# Patient Record
Sex: Female | Born: 1970 | Race: White | Hispanic: No | Marital: Married | Smoking: Never smoker
Health system: Southern US, Community
[De-identification: ages and names within clinical notes are randomized; demographics above are authoritative.]

## PROBLEM LIST (undated history)

## (undated) DIAGNOSIS — K30 Functional dyspepsia: Secondary | ICD-10-CM

## (undated) DIAGNOSIS — K219 Gastro-esophageal reflux disease without esophagitis: Secondary | ICD-10-CM

## (undated) DIAGNOSIS — Z975 Presence of (intrauterine) contraceptive device: Secondary | ICD-10-CM

## (undated) DIAGNOSIS — K9049 Malabsorption due to intolerance, not elsewhere classified: Secondary | ICD-10-CM

## (undated) HISTORY — DX: Gastro-esophageal reflux disease without esophagitis: K21.9

## (undated) HISTORY — DX: Malabsorption due to intolerance, not elsewhere classified: K90.49

## (undated) HISTORY — DX: Functional dyspepsia: K30

## (undated) HISTORY — DX: Presence of (intrauterine) contraceptive device: Z97.5

---

## 2005-12-30 ENCOUNTER — Ambulatory Visit (HOSPITAL_COMMUNITY): Admission: RE | Admit: 2005-12-30 | Discharge: 2005-12-30 | Payer: Self-pay | Admitting: Urology

## 2006-12-29 ENCOUNTER — Encounter: Payer: Self-pay | Admitting: Obstetrics and Gynecology

## 2006-12-29 ENCOUNTER — Ambulatory Visit: Payer: Self-pay | Admitting: Gynecology

## 2006-12-29 ENCOUNTER — Inpatient Hospital Stay (HOSPITAL_COMMUNITY): Admission: AD | Admit: 2006-12-29 | Discharge: 2006-12-31 | Payer: Self-pay | Admitting: Obstetrics and Gynecology

## 2007-09-27 ENCOUNTER — Other Ambulatory Visit: Admission: RE | Admit: 2007-09-27 | Discharge: 2007-09-27 | Payer: Self-pay | Admitting: Obstetrics and Gynecology

## 2008-04-10 ENCOUNTER — Ambulatory Visit (HOSPITAL_COMMUNITY): Admission: RE | Admit: 2008-04-10 | Discharge: 2008-04-10 | Payer: Self-pay | Admitting: Family Medicine

## 2008-05-06 ENCOUNTER — Ambulatory Visit: Payer: Self-pay | Admitting: Urgent Care

## 2008-05-31 ENCOUNTER — Ambulatory Visit: Payer: Self-pay | Admitting: Gastroenterology

## 2008-05-31 ENCOUNTER — Ambulatory Visit (HOSPITAL_COMMUNITY): Admission: RE | Admit: 2008-05-31 | Discharge: 2008-05-31 | Payer: Self-pay | Admitting: Gastroenterology

## 2008-05-31 ENCOUNTER — Encounter: Payer: Self-pay | Admitting: Gastroenterology

## 2008-05-31 HISTORY — PX: ESOPHAGOGASTRODUODENOSCOPY: SHX1529

## 2008-10-22 ENCOUNTER — Other Ambulatory Visit: Admission: RE | Admit: 2008-10-22 | Discharge: 2008-10-22 | Payer: Self-pay | Admitting: Obstetrics and Gynecology

## 2009-05-09 ENCOUNTER — Ambulatory Visit (HOSPITAL_COMMUNITY): Admission: RE | Admit: 2009-05-09 | Discharge: 2009-05-09 | Payer: Self-pay | Admitting: Family Medicine

## 2009-07-23 IMAGING — US US ABDOMEN COMPLETE
1 series · 14 of 25 positions shown · non-contrast
Comparison: None

CLINICAL DATA: Epigastric abdominal pain, PPI responsive

ABDOMEN ULTRASOUND
TECHNIQUE: Complete abdominal ultrasound examination was performed
including evaluation of the liver, gallbladder, bile ducts,
pancreas, kidneys, spleen, IVC, and abdominal aorta.

[Series 1: unknown · 0.28mm/px · 14 of 87 slices shown]
[im 1/87]
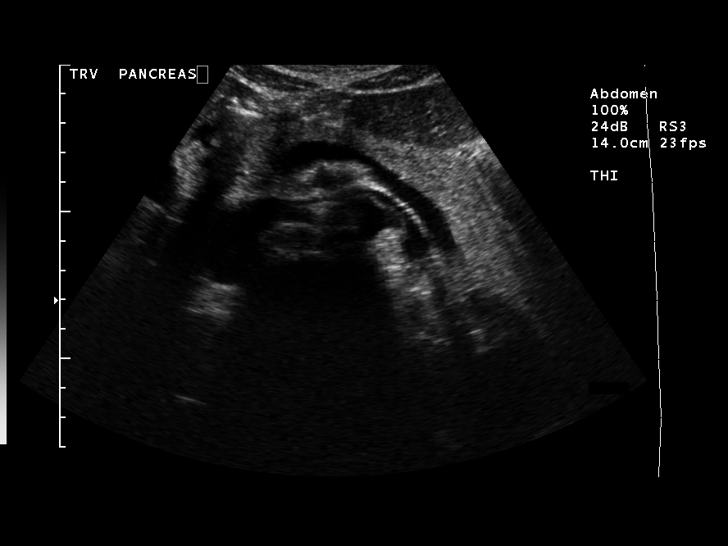
[im 8/87]
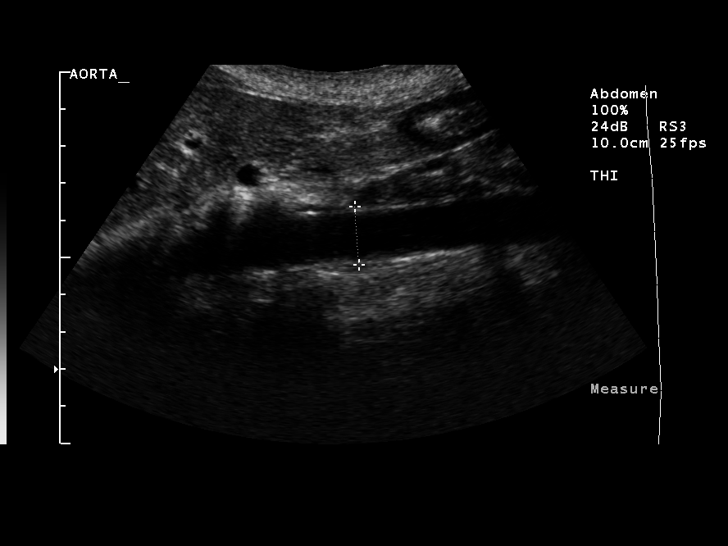
[im 15/87]
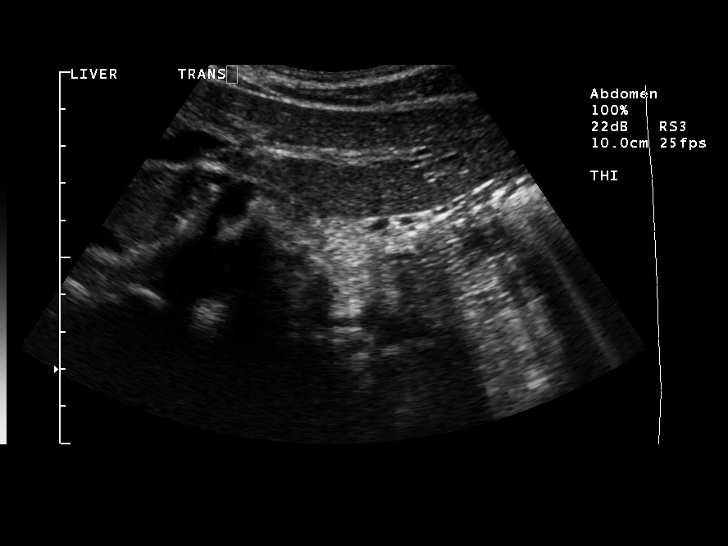
[im 22/87]
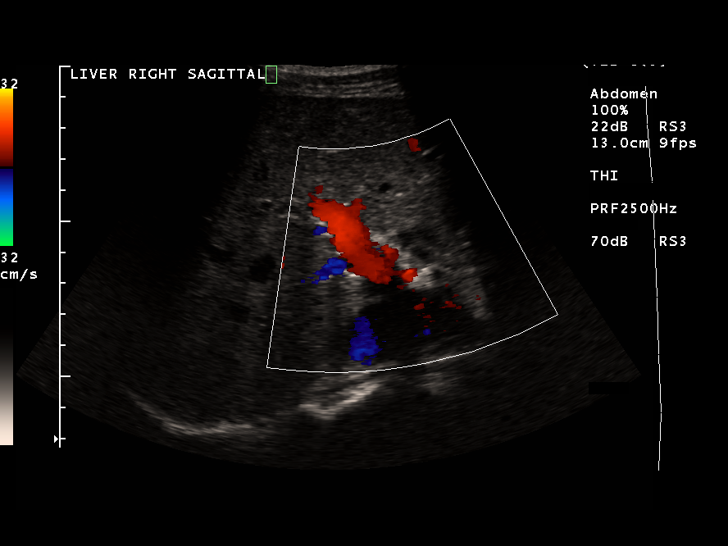
[im 29/87]
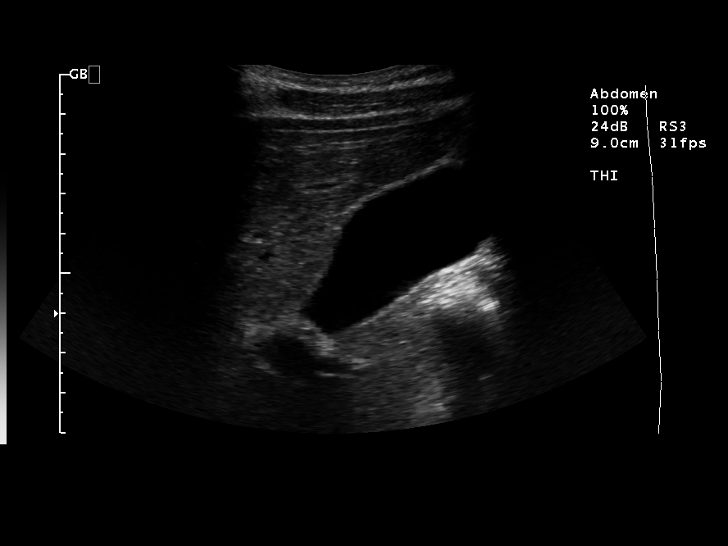
[im 33/87]
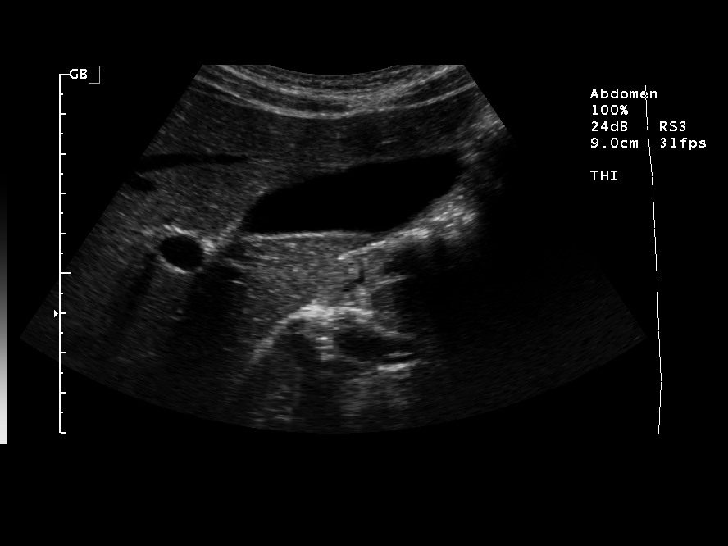
[im 40/87]
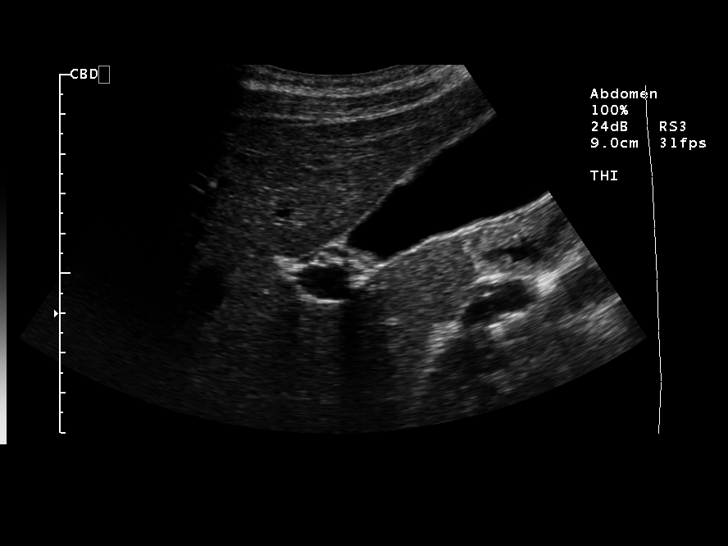
[im 47/87]
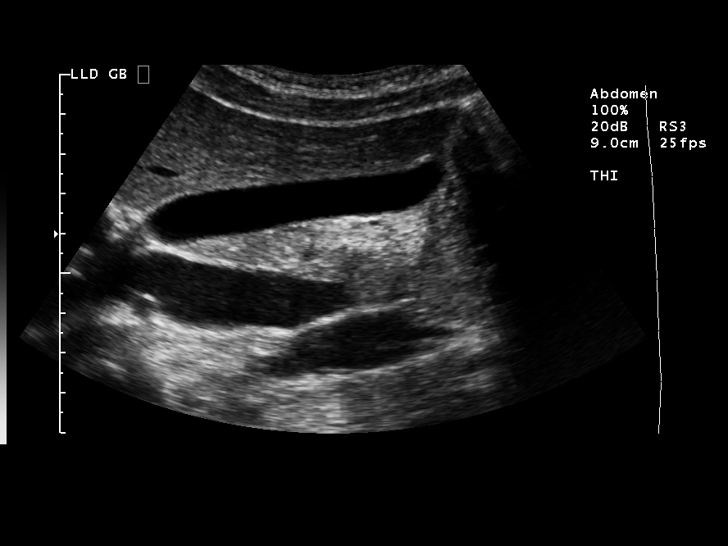
[im 54/87]
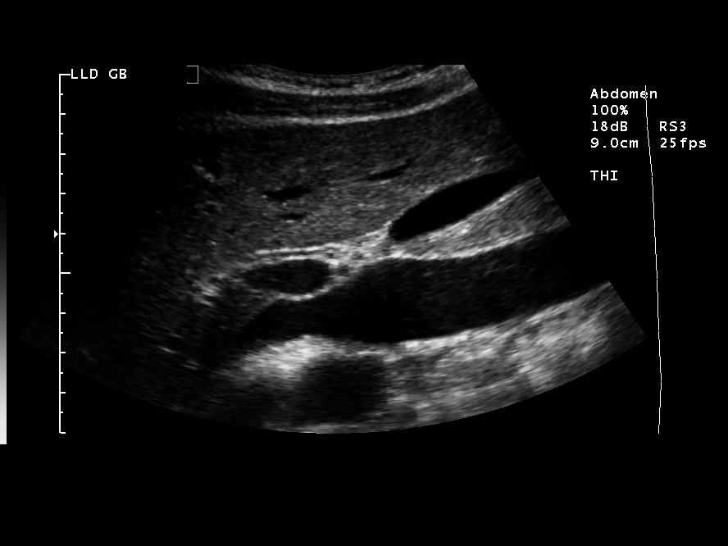
[im 58/87]
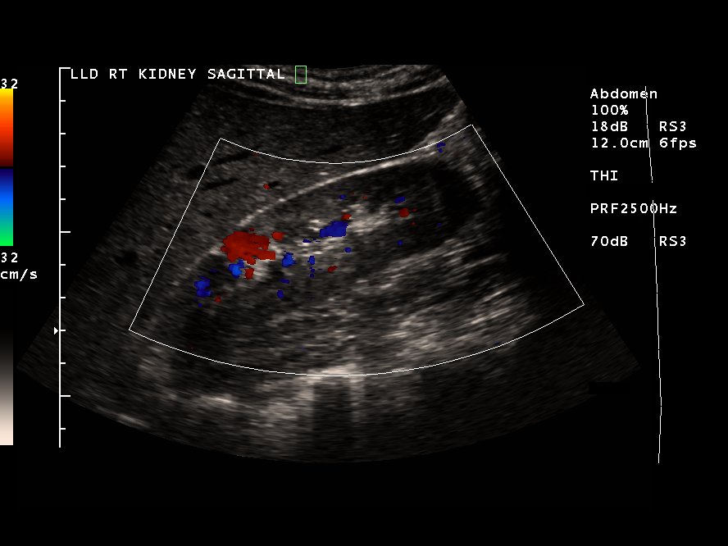
[im 65/87]
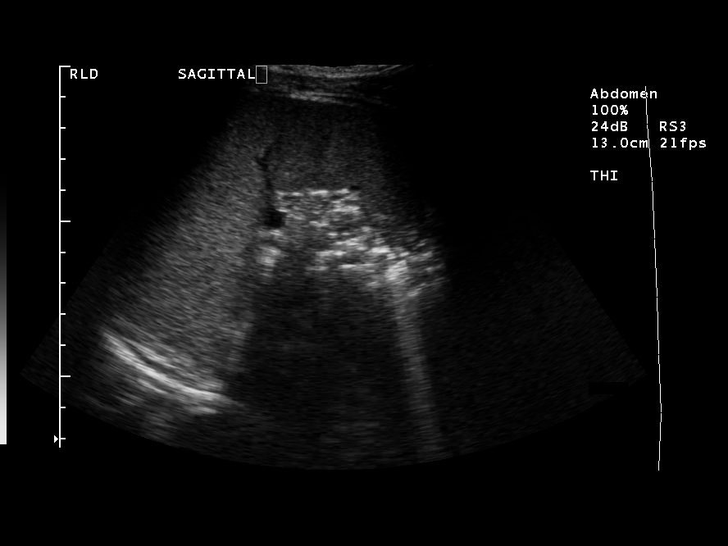
[im 72/87]
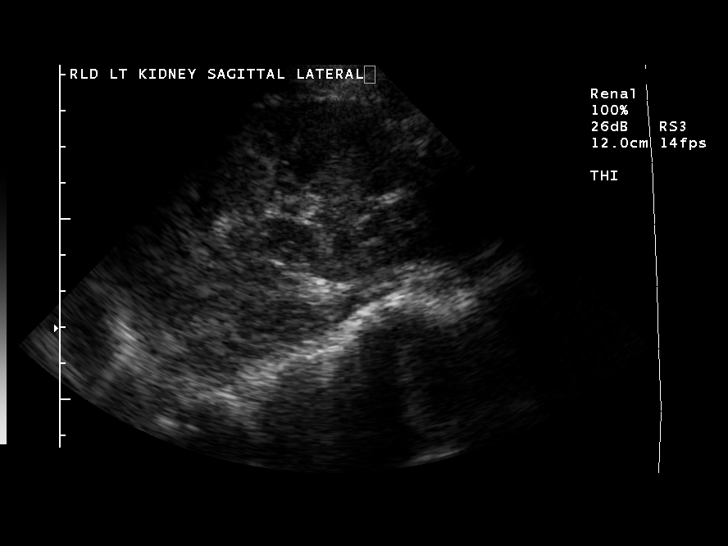
[im 79/87]
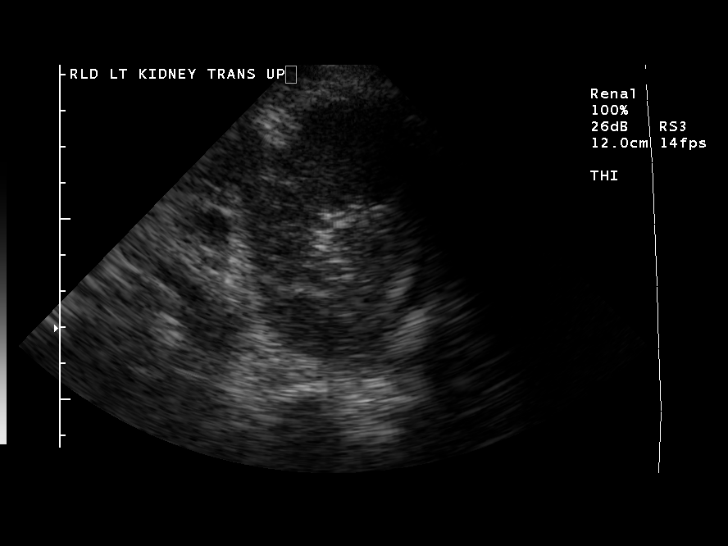
[im 87/87]
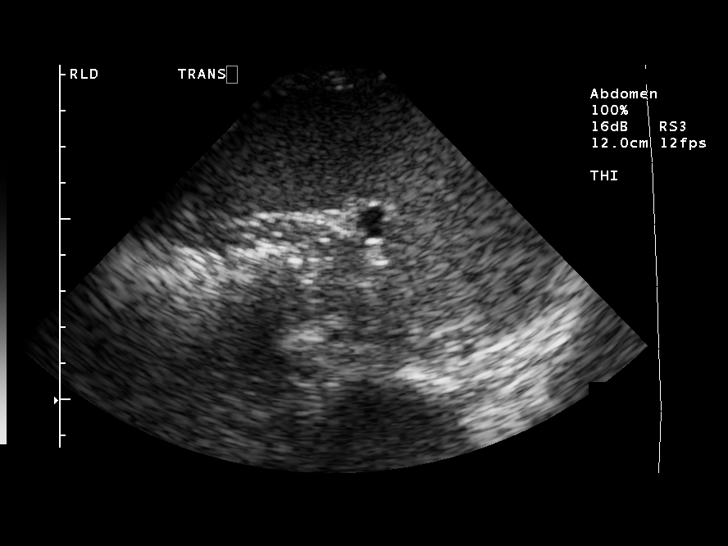

[14 of 25 positions shown; findings below may reference images not displayed]

FINDINGS: Gallbladder normally distended without stones or wall thickening.
No sonographic Murphy sign.
Common bile duct normal caliber, 2.1 mm diameter.
Liver, pancreas, and spleen normal appearance, spleen 11.1 cm
length.
Kidneys normal size and morphology, 11.4 cm length right and
cm length left.
Aorta and IVC normal.
No free fluid.
IMPRESSION: Normal upper abdominal ultrasound.

## 2009-11-05 ENCOUNTER — Other Ambulatory Visit: Admission: RE | Admit: 2009-11-05 | Discharge: 2009-11-05 | Payer: Self-pay | Admitting: Obstetrics & Gynecology

## 2010-01-30 DIAGNOSIS — Z8719 Personal history of other diseases of the digestive system: Secondary | ICD-10-CM

## 2010-01-30 DIAGNOSIS — K219 Gastro-esophageal reflux disease without esophagitis: Secondary | ICD-10-CM | POA: Insufficient documentation

## 2010-02-06 ENCOUNTER — Ambulatory Visit: Payer: Self-pay | Admitting: Gastroenterology

## 2010-02-09 DIAGNOSIS — K30 Functional dyspepsia: Secondary | ICD-10-CM | POA: Insufficient documentation

## 2010-04-02 ENCOUNTER — Ambulatory Visit: Payer: Self-pay | Admitting: Gastroenterology

## 2010-04-27 ENCOUNTER — Ambulatory Visit
Admission: RE | Admit: 2010-04-27 | Discharge: 2010-04-27 | Payer: Self-pay | Source: Home / Self Care | Attending: Urgent Care | Admitting: Urgent Care

## 2010-05-03 ENCOUNTER — Encounter: Payer: Self-pay | Admitting: Family Medicine

## 2010-05-04 ENCOUNTER — Encounter: Payer: Self-pay | Admitting: Urgent Care

## 2010-05-08 ENCOUNTER — Encounter: Payer: Self-pay | Admitting: Urgent Care

## 2010-05-12 NOTE — Assessment & Plan Note (Signed)
Summary: GERD, DYSPEPSIA   Visit Type:  Follow-up Visit Primary Care Provider:  [facility]  Chief Complaint:  reflux.  History of Present Illness: Was on Prevacid and in JAN 2011 changed to Nexium once daily. Can have breakthrough Sx. 2-3 weeks fine and then may have a week when she's constantly bothers her. Triggers: stressful, EtOH, omega-3. Weight steady. Appetite: good. No problems swallowing. Can have nausea with flares but never has thrown up. usu. during the day and not awakened with Sx.  Current Medications (verified): 1)  Nexium 40 Mg Cpdr (Esomeprazole Magnesium) .... 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 Eating on the run.  Husband travels and left alone to take care of 39 yo. No tobacco.  Review of Systems       DEC 2009: UGI-gastric polyps, NO REFLUX, NL SB    ABD U/S-NORMAL     130 LBS  H. PYLORI SEROLOGY NEG  Vital Signs:  Patient profile:   40 year old female Height:      66 inches Weight:      131 pounds BMI:     21.22 Temp:     97.6 degrees F oral Pulse rate:   64 / minute BP sitting:   100 / 70  (left arm) Cuff size:   regular  Vitals Entered By: Cloria Spring LPN (February 06, 2010 4:16 PM)  Physical Exam  General:  Well developed, well nourished, no acute distress. Head:  Normocephalic and atraumatic. Eyes:  PERRL, no icterus. Mouth:  No deformity or lesions. Lungs:  Clear throughout to auscultation. Heart:  Regular rate and rhythm; no murmurs. Abdomen:  Soft, nontender and nondistended. Normal bowel sounds.  Impression & Recommendations:  Problem # 1:  GERD (ICD-530.81) Pt most LIKELY HAS GERD, BUT FAVOR NON-ULCER DYSPEPSIA as etiology for her current Sx. Increase Nexium  to two times a day. Add Imipramine at bedtime.  Avoid reflux triggers if possible. SEE HO. FOLLOW UP IN 6 WEEKS.  Orders: Est. Patient Level IV (32440)  Problem # 2:  DYSPEPSIA (ICD-536.8) See#1  Orders: Est. Patient Level IV (10272)  CC: PCP  Patient Instructions: 1)  YOU MOST LIKELY HAVE GERD AND NON-ULCER DYSPEPSIA. 2)  Increase Nexium to two times a day. 3)  Add Imipramine at bedtime.  4)  Avoid reflux triggers if possible. SEE HO. 5)  FOLLOW UP IN 6 WEEKS. 6)  The medication list was reviewed and reconciled.  All changed / newly prescribed medications were explained.  A complete medication list was provided to the patient / caregiver. Prescriptions: NEXIUM 40 MG CPDR (ESOMEPRAZOLE MAGNESIUM) Take 1 tablet by mouth 30 minutes before breakfast and lunch.  Dx: GERD, FAILED ONCE A DAY THERAPY  #180 x 3   Entered and Authorized by:   West Bali MD   Signed by:   West Bali MD on 02/06/2010   Method used:   Print then Give to Patient   RxID:   [id number] IMIPRAMINE HCL 10 MG TABS (IMIPRAMINE HCL) 1 by mouth qhs  #30 x 5   Entered and Authorized by:   West Bali MD   Signed by:   West Bali MD on 02/06/2010   Method used:   Electronically to  [facility]* (retail)       [address]       [place], [place]  [address]       Ph: [phone]       Fax: [phone]   RxID:   [id number]   Appended Document: GERD, DYSPEPSIA 6 WK F/U APPT IS IN THE COMPUTER

## 2010-05-14 NOTE — Medication Information (Signed)
Summary: imipramine rx  imipramine rx   Imported By: Hendricks Limes LPN 16/01/9603 54:09:81  _____________________________________________________________________  External Attachment:    Type:   Image     Comment:   External Document

## 2010-05-14 NOTE — Medication Information (Signed)
Summary: Tax adviser   Imported By: Rexene Alberts 05/04/2010 08:26:20  _____________________________________________________________________  External Attachment:    Type:   Image     Comment:   External Document  Appended Document: RX Folder Please let pharm know.  Verify Imipramine 10mg  at bedtime. Thanks  Appended Document: RX Folder Informed Casimiro Needle, Teacher, early years/pre at [employer].

## 2010-05-14 NOTE — Assessment & Plan Note (Signed)
Summary: gerd/dypepsia/6 wk fu per SF/ss   Visit Type:  Follow-up Visit Primary Care Provider:  [facility]  Chief Complaint:  F/U gerd/dyspepsia.  History of Present Illness: 40 y/o caucasian female here for FU non-ulcer dyspepsia & GERD.  On imipramine 10mg  at bedtime & nexium 40mg  daily.  Doing very well.  100% better most days, except occassionally takes two times a day nexium usually around stress twice per month.  Denies nausea, vomiting, anorexia, wt loss, dysphagia, or odynophagia.    Current Problems (verified): 1)  Dyspepsia  (ICD-536.8) 2)  Gerd  (ICD-530.81) 3)  Gastric Polyp, Hx of  (ICD-V12.79)  Current Medications (verified): 1)  Nexium 40 Mg Cpdr (Esomeprazole Magnesium) .... Take 1 Tablet By Mouth Once A Day 2)  Imipramine Hcl 10 Mg Tabs (Imipramine Hcl) .Marland Kitchen.. 1 By Mouth Qhs  Allergies (verified): No Known Drug Allergies  Review of Systems      See HPI General:  Denies fever, chills, sweats, anorexia, fatigue, weakness, malaise, weight loss, and sleep disorder. CV:  Denies chest pains, angina, palpitations, syncope, dyspnea on exertion, orthopnea, PND, peripheral edema, and claudication. Resp:  Denies dyspnea at rest, dyspnea with exercise, cough, sputum, wheezing, coughing up blood, and pleurisy. GI:  See HPI; Denies jaundice and fecal incontinence. GU:  Denies urinary burning, blood in urine, nocturnal urination, urinary frequency, urinary incontinence, abnormal vaginal bleeding, amenorrhea, menorrhagia, and vaginal discharge; No periods since Mirena IUD 3 yrs. Derm:  Denies rash, itching, dry skin, hives, moles, warts, and unhealing ulcers. Psych:  Denies depression, anxiety, memory loss, suicidal ideation, hallucinations, paranoia, phobia, and confusion. Heme:  Denies bruising, bleeding, and enlarged lymph nodes.  Vital Signs:  Patient profile:   40 year old female Height:      66 inches Weight:      131.50 pounds BMI:     21.30 Temp:     98.0 degrees F  oral Pulse rate:   60 / minute BP sitting:   100 / 62  (left arm) Cuff size:   regular  Vitals Entered By: Cloria Spring LPN (April 27, 2010 11:02 AM)  Physical Exam  General:  Well developed, well nourished, no acute distress. Head:  Normocephalic and atraumatic. Eyes:  sclera clear, no icterus. Mouth:  No deformity or lesions. Neck:  Supple; no masses or thyromegaly. Heart:  Regular rate and rhythm; no murmurs. Abdomen:  Soft, nontender and nondistended. No masses, hepatosplenomegaly or hernias noted. Normal bowel sounds.without guarding and without rebound.   Msk:  Symmetrical with no gross deformities. Normal posture. Extremities:  No clubbing, cyanosis, edema or deformities noted. Neurologic:  Alert and  oriented x4;  grossly normal neurologically. Skin:  Intact without significant lesions or rashes. Cervical Nodes:  No significant cervical adenopathy. Psych:  Alert and cooperative. Normal mood and affect.  Impression & Recommendations:  Problem # 1:  GERD (ICD-530.81) Doing very well on Nexium 40 mg daily  Orders: Est. Patient Level III (16109)  Problem # 2:  DYSPEPSIA (ICD-536.8) Non-ulcer,  Improved w/ addition of imipramine  Medications Added to Medication List This Visit: 1)  Nexium 40 Mg Cpdr (Esomeprazole magnesium) .... Take 1 tablet by mouth once a day  Patient Instructions: 1)  Please schedule a follow-up appointment in 6 months. 2)  Continue imipramine & nexium  Prescriptions: IMIPRAMINE HCL 10 MG TABS (IMIPRAMINE HCL) 1 by mouth qhs  #90 x 3   Entered and Authorized by:   Joselyn Arrow FNP-BC   Signed by:  Joselyn Arrow FNP-BC on 04/27/2010   Method used:   Print then Give to Patient   RxID:   [id number]   Appended Document: gerd/dypepsia/6 wk fu per SF/ss 6 MONTH F/U OPV IS IN THE COMPUTER

## 2010-06-23 ENCOUNTER — Encounter: Payer: Self-pay | Admitting: Urgent Care

## 2010-06-30 NOTE — Letter (Signed)
Summary: NEW RX REQ IMIPRAMINE POW  NEW RX REQ IMIPRAMINE POW   Imported By: Rexene Alberts 06/23/2010 10:58:03  _____________________________________________________________________  External Attachment:    Type:   Image     Comment:   External Document

## 2010-08-21 IMAGING — US US ABDOMEN COMPLETE
1 series · 14 of 25 positions shown · non-contrast
Comparison: None.

CLINICAL DATA: Abdominal pain.  Gastroesophageal reflux disease.

ABDOMINAL ULTRASOUND COMPLETE

[Series 1: us abdomen complete · 0.30mm/px · 14 of 60 slices shown]
[im 1/60]
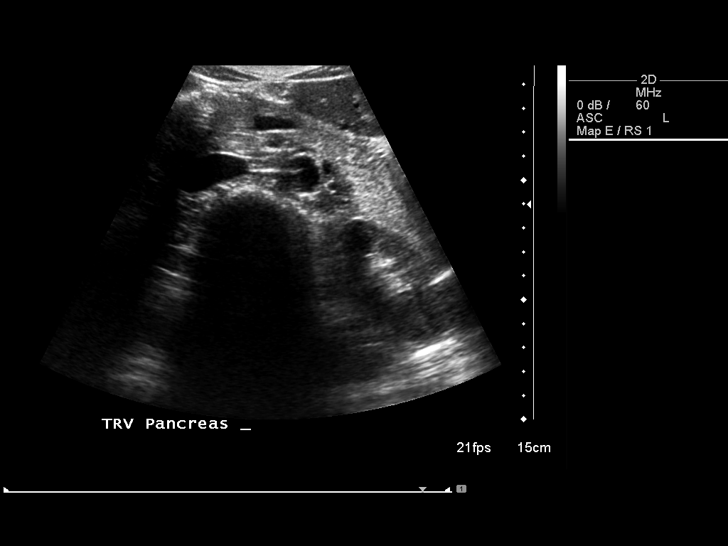
[im 5/60]
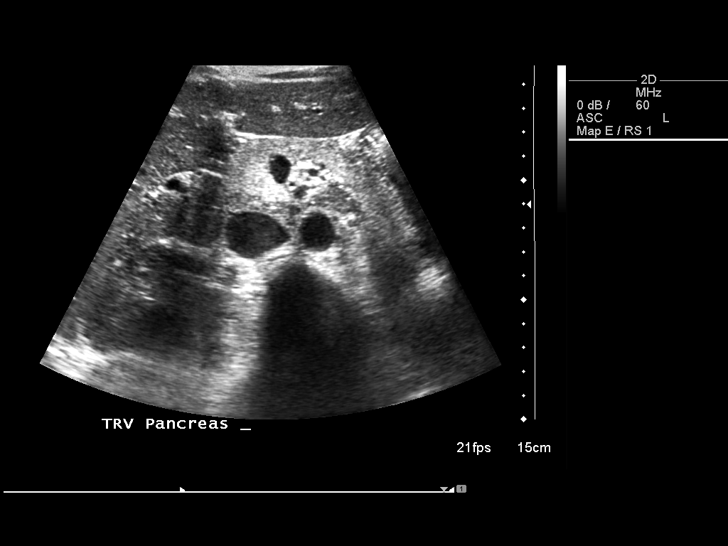
[im 10/60]
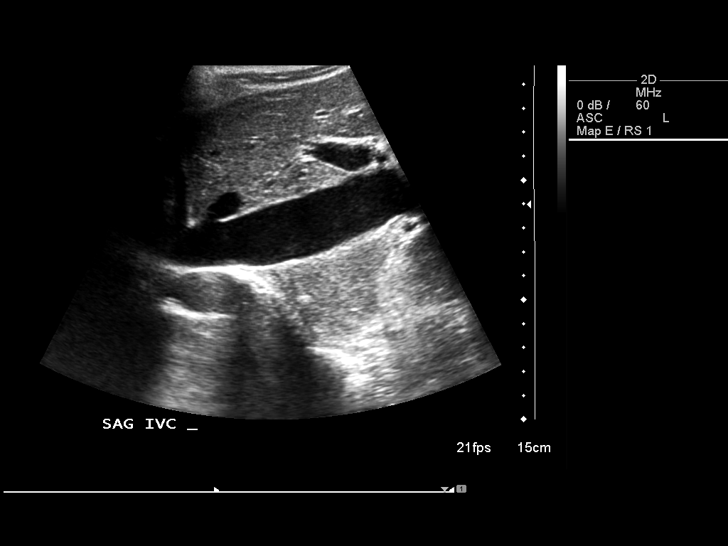
[im 15/60]
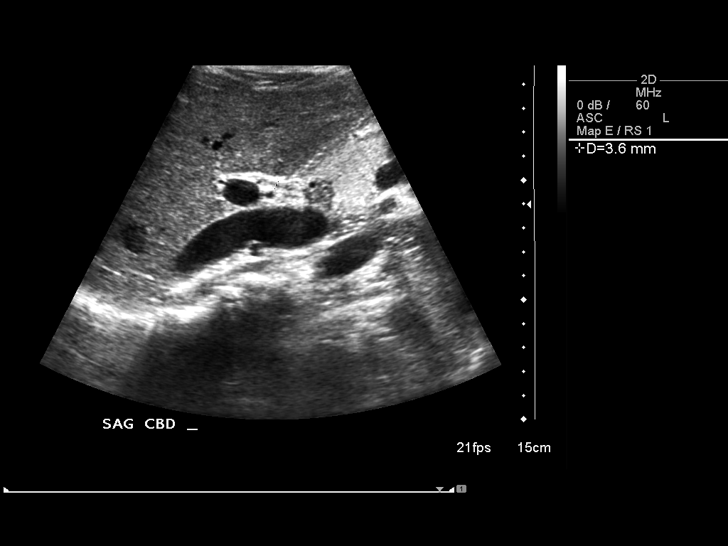
[im 20/60]
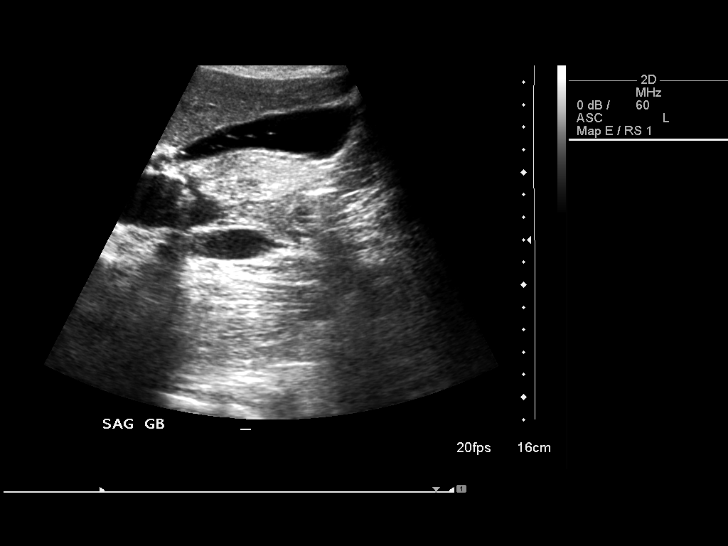
[im 23/60]
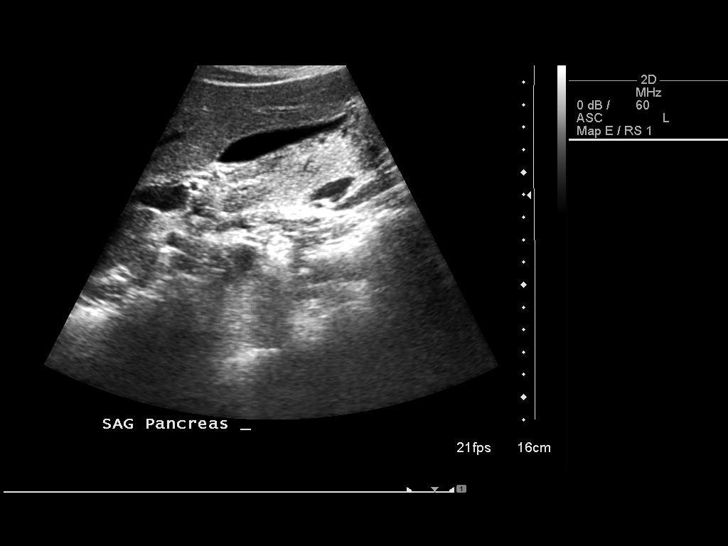
[im 28/60]
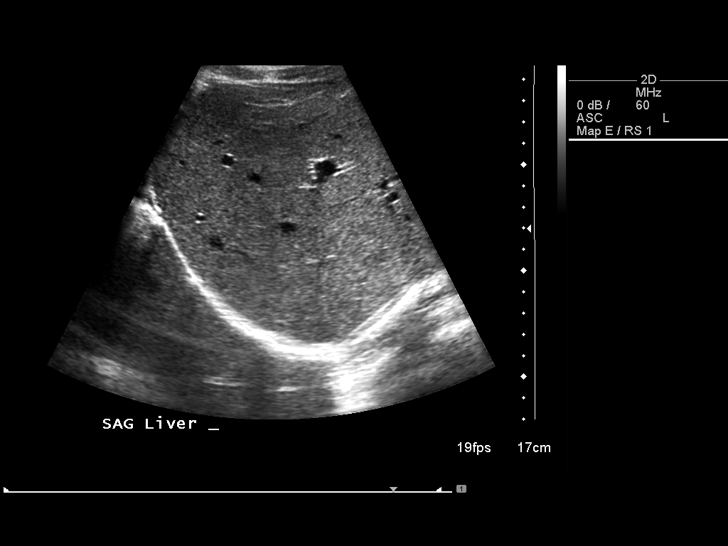
[im 32/60]
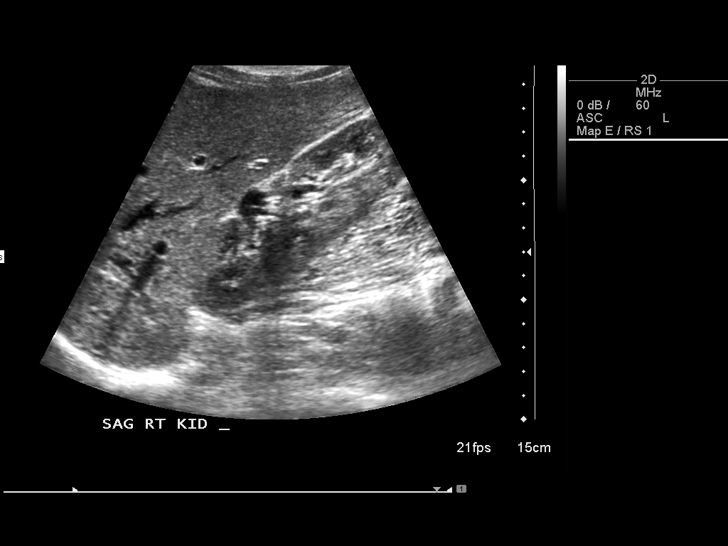
[im 37/60]
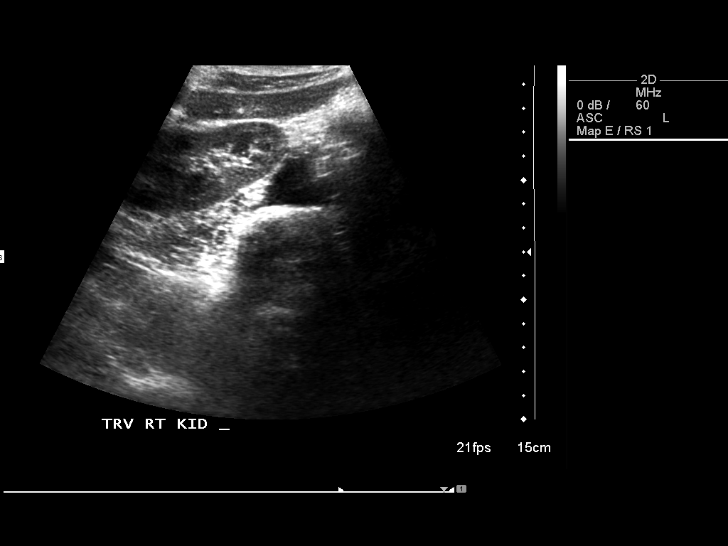
[im 40/60]
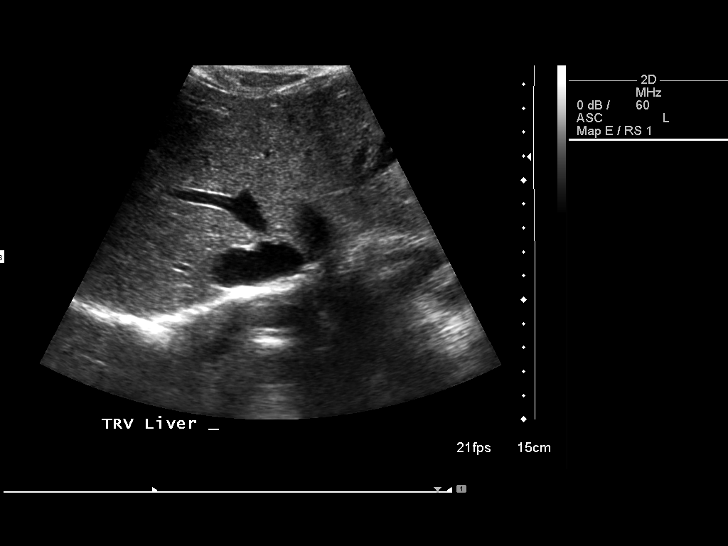
[im 45/60]
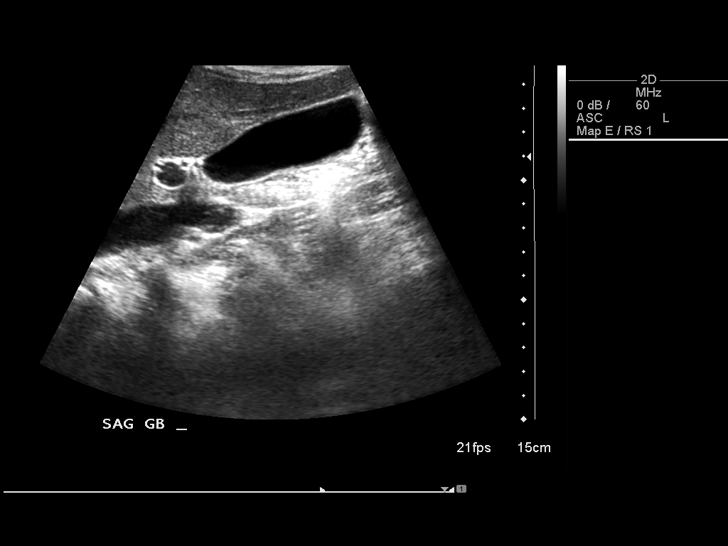
[im 50/60]
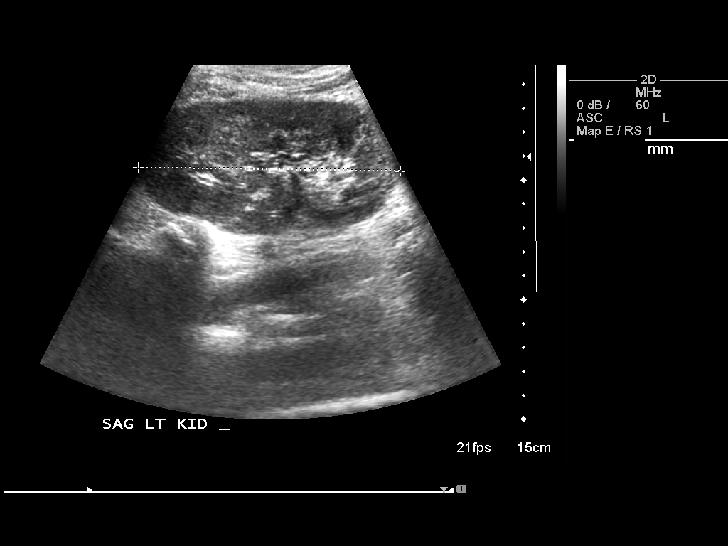
[im 55/60]
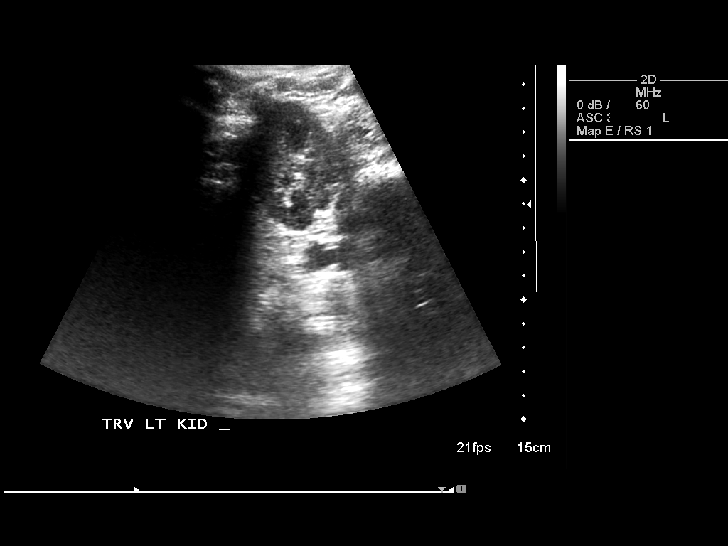
[im 60/60]
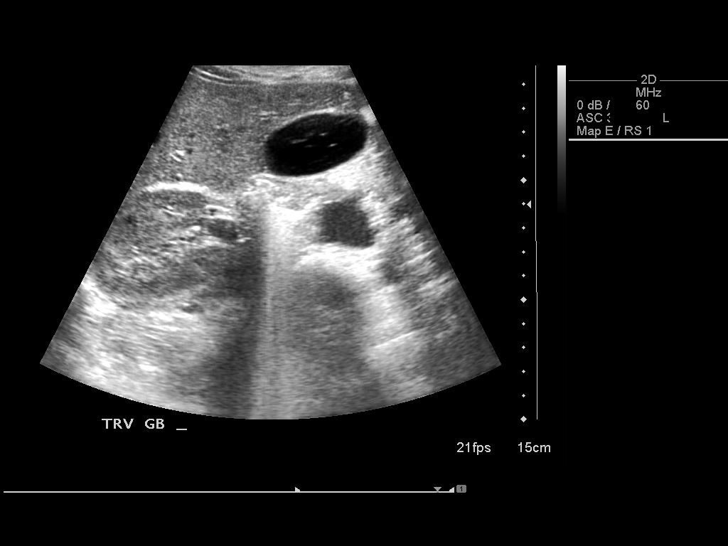

[14 of 25 positions shown; findings below may reference images not displayed]

FINDINGS: Gallbladder:  No gallstones, gallbladder wall thickening, or
pericholecystic fluid.  There is gallbladder sludge and small
echogenic foci which may be due [REDACTED]s.  No distinct
gallstones.

Common Bile Duct:  Within normal limits in caliber.  3.6 mm
diameter of the CBD.

Liver:  No focal lesion identified.  Within normal limits in
parenchymal echogenicity.

IVC:  Appears normal.

Pancreas:  Although the pancreas is difficult to visualize in its
entirety, no focal pancreatic abnormality is identified.

Spleen:  Within normal limits in size and echotexture.  Splenic
length is 9.6 cm.

Right kidney:  Normal in size and parenchymal echogenicity.  No
evidence of mass or hydronephrosis.   Right kidney measures 11.4 cm
in length.

Left kidney:  Normal in size and parenchymal echogenicity.  No
evidence of mass or hydronephrosis.  Left kidney measures 10.9 cm
in length.

Abdominal Aorta:  No aneurysm identified.
IMPRESSION: No definite gallstones.  No biliary ductal dilatation.  No acute
findings.

## 2010-08-25 NOTE — Consult Note (Signed)
NAMECAMARIA, [name] NO.:  [id number]   MEDICAL RECORD NO.:  [id number]          PATIENT TYPE:  AMB   LOCATION:  DAY                           FACILITY:  [facility]   PHYSICIAN:  Kassie Mends, M.D.      DATE OF BIRTH:  07-31-1970   DATE OF CONSULTATION:  DATE OF DISCHARGE:                                 CONSULTATION   REFERRING PHYSICIAN:  Patrica Duel, MD   REASON FOR CONSULTATION:  Gastric polyps and refractory GERD.   HISTORY OF PRESENT ILLNESS:  [name] is a 40 year old Caucasian  female.  [name] has had refractory heartburn and indigestion and just  overall sense of churning in acid in her stomach and abdomen since  December 2009.  [name] has had some transient nausea without vomiting as  well.  [name] has had some anorexia and abdominal bloating.  [name] has been  on Prevacid 30 mg daily for 2 years now.  [name] denies any dysphagia or  odynophagia.  [name] is having normal soft brown bowel movements.  Denies  any diarrhea, constipation, rectal bleeding, or melena.  Her weight has  remained stable.  Her appetite is good.  [name] rarely takes ibuprofen for  headaches about once a week.  Upper GI series on April 10, 2008,  shows at least 3 polypoid lesions within the mid stomach.  [name] had an  abdominal ultrasound on April 10, 2008, which was normal.  [name] had a  negative H. pylori.   CURRENT MEDICATIONS:  Prevacid 30 mg daily.   ALLERGIES:  No known drug allergies.   PAST MEDICAL HISTORY:  [name] had an IUD placed in December 2008.  [name] has  chronic GERD.   FAMILY HISTORY:  There is no known family history of colorectal  carcinoma, liver or chronic GI problems.  [name] are both in  their 75s and healthy.  [name] has 1 healthy brother.   SOCIAL HISTORY:  [name] has been married for 12 years.  [name] has 1  healthy 59-month-old son.  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  [name] denies any tobacco or drug use.  [name] usually has a glass  of rum and coke in  the evenings.   REVIEW OF SYSTEMS:  See HPI.  Otherwise negative.   PHYSICAL EXAMINATION:  VITAL SIGNS:  Weight 131 pounds.  Height 56  inches, temperature 97, and blood pressure 88/60, pulse 72.  GENERAL:  [name] is a thin Caucasian female who is alert, oriented,  pleasant, and cooperative in no acute distress.  HEENT:  Sclerae clear, nonicteric.  Conjunctivae pink.  Oropharynx pink  and moist without any lesions.  NECK:  Supple.  [name] does have some shoddy both from submandibular and  anterior cervical nodes.  CHEST:  Heart regular rate and rhythm.  Normal S1, S2 without any  murmurs, clicks, rubs, or gallops.  LUNGS:  Clear to auscultation bilaterally.  ABDOMEN:  Positive bowel sounds x4.  No bruits auscultated.  Soft,  nontender, and nondistended without palpable mass or hepatosplenomegaly.  No rebound, tenderness or guarding.  EXTREMITIES:  Without clubbing or edema.   IMPRESSION:  [name] is a 40 year old Caucasian female with  refractory gastroesophageal reflux disease as well as abdominal bloating  despite proton pump inhibitor.  [name] also has several lesions seen on  upper gastrointestinal, which seem to be polypoid.  [name] is going to  require further evaluation to rule out gastric polyps versus malignancy  versus peptic ulcer disease, versus gastritis.   PLAN:  1. [name] should continue Prevacid 30 mg daily for now.  2. EGD with Dr. Cira Servant in the near future.  Discussed this procedure      including risks and benefits which include but are not limited to      bleeding, infection, perforation, or drug reaction.  [name] agrees      with the plan and consent will be obtained.   Thank you Dr. Nobie Putnam for allowing [place] to participate in the care of Ms.  Chang.      Lorenza Burton, N.P.      Kassie Mends, M.D.  Electronically Signed    KJ/MEDQ  D:  05/06/2008  T:  05/07/2008  Job:  [id number]   cc:   Patrica Duel, M.D.  Fax: [phone]

## 2010-08-25 NOTE — Op Note (Signed)
NAMERAMESHA, POSTER                 ACCOUNT NO.:  [id number]   MEDICAL RECORD NO.:  [id number]          PATIENT TYPE:  AMB   LOCATION:  DAY                           FACILITY:  [facility]   PHYSICIAN:  Kassie Mends, M.D.      DATE OF BIRTH:  Aug 08, 1970   DATE OF PROCEDURE:  05/31/2008  DATE OF DISCHARGE:                               OPERATIVE REPORT   REFERRING PHYSICIAN:  Patrica Duel, MD   PROCEDURE:  Esophagogastroduodenoscopy with cold forceps biopsy.   INDICATION FOR EXAM:  Ms. Furrow is a 40 year old female who complained  of heartburn and indigestion.  She states that while she takes her  Prevacid, she does well.  She try to stop it and her symptoms recur.  She did have a upper GI series in December, which showed polypoid  lesions in the stomach.  The procedure is being performed to evaluate  the polyps.   FINDINGS:  1. Normal esophagus without evidence of Barrett mass, erosion,      ulceration, or stricture.  2. Multiple benign-appearing gastric polyps.  The polyps were in the      body and the fundus.  Biopsies were obtained via cold forceps.  No      evidence of ulcers, erosions, or erythema.  3. Normal duodenal bulb, ampulla, and second portion of the duodenum.   DIAGNOSIS:  Gastric polyps, likely secondary to fundic gland polyps.   RECOMMENDATIONS:  1. No aspirin, NSAIDs, or anticoagulation for 5 days.  She may resume      her previous diet.  She should continue Prevacid indefinitely.  2. Will call her with the results of her biopsies.   MEDICATIONS:  1. Demerol 50 mg IV.  2. Versed 4 mg IV.   PROCEDURE TECHNIQUE:  Physical exam was performed.  Informed consent was  obtained from the patient after explaining benefits, risks, and  alternatives to the procedure.  The patient was connected to the monitor  and placed in left lateral position.  Continuous oxygen was provided by  nasal cannula.  IV medicine administered through an indwelling cannula.  After administration  of sedation, the patient's esophagus was intubated.  Scope was advanced under direct visualization to the second portion of  the duodenum.  Scope was moved slowly by careful examining the color,  texture, anatomy, and integrity mucosa on the way out.  The patient was  recovered in endoscopy and discharged home in satisfactory condition.  The patient was a little bit of difficulty arouse prior to leaving the  endoscopy room.  She spent some time in the PACU.  She did have recorded  heart rate in the high 40s while she was sleeping.  She remained  hemodynamically stable.   PATH:  Benign polyps. Continue Prevacid.      Kassie Mends, M.D.  Electronically Signed     SM/MEDQ  D:  05/31/2008  T:  06/01/2008  Job:  [id number]   cc:   Patrica Duel, M.D.  Fax: [phone]

## 2010-08-31 ENCOUNTER — Other Ambulatory Visit: Payer: Self-pay | Admitting: Gastroenterology

## 2010-09-01 NOTE — Telephone Encounter (Signed)
LMOM for pt to call in reference to prescriptions.

## 2010-09-01 NOTE — Telephone Encounter (Signed)
Spoke with pharmacist at [facility], pt has been getting refills from Rx written by Dr. Darrick Penna for # 30 tablets, started back in 02/06/2010.    Also, I called [facility], pt had original Rx filled on 05/03/2010 for # 90. She had refill on 08/04/2010 for # 90.

## 2010-09-01 NOTE — Telephone Encounter (Signed)
Patient was given #90 with 3 refills in 04/2010 by KJ. Should last her for one year. Find out why we are getting request.

## 2010-09-03 MED ORDER — IMIPRAMINE HCL 10 MG PO TABS: 10.0000 mg | ORAL_TABLET | Freq: Every day | ORAL | Status: DC

## 2010-09-03 NOTE — Telephone Encounter (Signed)
Pt said she has gotten the Imipramine at [address] and [place]. But she is only taking one tablet daily. She said the ones from [facility] didn't seem to help as much and she had stomach problems when she was taking that so she went back to [facility] and got the Rx and it seem to help more.

## 2010-09-03 NOTE — Telephone Encounter (Signed)
Will send two refills to [facility]. She should not take both imipramines at same time.

## 2010-09-03 NOTE — Telephone Encounter (Signed)
LMOM for a return call @ home. Work number has been changed/disconnected.

## 2010-09-04 NOTE — Telephone Encounter (Signed)
LMOM for pt to call. 

## 2010-09-08 NOTE — Telephone Encounter (Signed)
Pt informed. Said he threw away the ones from [facility] because they didn't work.

## 2010-10-15 ENCOUNTER — Encounter: Payer: Self-pay | Admitting: Gastroenterology

## 2010-11-03 ENCOUNTER — Ambulatory Visit (INDEPENDENT_AMBULATORY_CARE_PROVIDER_SITE_OTHER): Payer: 59 | Admitting: Gastroenterology

## 2010-11-03 ENCOUNTER — Encounter: Payer: Self-pay | Admitting: Gastroenterology

## 2010-11-03 DIAGNOSIS — K3189 Other diseases of stomach and duodenum: Secondary | ICD-10-CM

## 2010-11-03 DIAGNOSIS — K219 Gastro-esophageal reflux disease without esophagitis: Secondary | ICD-10-CM

## 2010-11-03 DIAGNOSIS — K30 Functional dyspepsia: Secondary | ICD-10-CM

## 2010-11-03 NOTE — Progress Notes (Signed)
Cc to PCP 

## 2010-11-03 NOTE — Progress Notes (Signed)
Primary Care Physician: Jonell Cluck, MD  Primary Gastroenterologist:  Jonette Eva, MD   Chief Complaint  Patient presents with  . Follow-up    HPI: [name] is a 41 y.o. female here for followup of nonulcer dyspepsia and GERD. Doing well on Nexium once in the morning and imipramine at night. No abdominal pain. No constipation or diarrhea. No heartburn, vomiting, unintentional weight loss, melena, rectal bleeding. She voiced concerns regarding chronic PPI use and calcium absorption. Questions were answered    Current Outpatient Prescriptions  Medication Sig Dispense Refill  . esomeprazole (NEXIUM) 40 MG capsule Take 40 mg by mouth daily before breakfast.        . imipramine (TOFRANIL) 10 MG tablet Take 1 tablet (10 mg total) by mouth at bedtime.  30 tablet  2    Allergies as of 11/03/2010  . (No Known Allergies)    ROS:  General: Negative for anorexia, weight loss, fever, chills, fatigue, weakness. ENT: Negative for hoarseness, difficulty swallowing , nasal congestion. CV: Negative for chest pain, angina, palpitations, dyspnea on exertion, peripheral edema.  Respiratory: Negative for dyspnea at rest, dyspnea on exertion, cough, sputum, wheezing.  GI: See history of present illness. GU:  Negative for dysuria, hematuria, urinary incontinence, urinary frequency, nocturnal urination.  Endo: Negative for unusual weight change.    Physical Examination:   BP 104/65  Pulse 72  Temp(Src) 97.4 F (36.3 C) (Temporal)  Ht 5\' 6"  (1.676 m)  Wt 131 lb 9.6 oz (59.693 kg)  BMI 21.24 kg/m2  General: Well-nourished, well-developed in no acute distress.  Eyes: No icterus. Mouth: Oropharyngeal mucosa moist and pink , no lesions erythema or exudate. Lungs: Clear to auscultation bilaterally.  Heart: Regular rate and rhythm, no murmurs rubs or gallops.  Abdomen: Bowel sounds are normal, nontender, nondistended, no hepatosplenomegaly or masses, no abdominal bruits or hernia , no  rebound or guarding.   Extremities: No lower extremity edema. No clubbing or edema. Neuro: Alert and oriented x 4   Skin: Warm and dry, no jaundice.   Psych: Alert and cooperative, normal mood and affect.

## 2010-11-03 NOTE — Assessment & Plan Note (Signed)
Doing well on imipramine and Nexium. Continue current regimen. Office visit in one year.

## 2010-11-03 NOTE — Assessment & Plan Note (Addendum)
Doing well on current regimen. 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 Advised that she could consider decreasing her Nexium to every other day however her abdominal pain returns or she has heartburn regularly she should resume the Nexium daily. She voiced her understanding. Office visit one year.  Continue antireflux measures.

## 2010-11-04 ENCOUNTER — Ambulatory Visit: Payer: Self-pay | Admitting: Gastroenterology

## 2010-11-10 ENCOUNTER — Other Ambulatory Visit: Payer: Self-pay | Admitting: Obstetrics & Gynecology

## 2010-11-10 ENCOUNTER — Other Ambulatory Visit (HOSPITAL_COMMUNITY)
Admission: RE | Admit: 2010-11-10 | Discharge: 2010-11-10 | Disposition: A | Discharge status: Home / Self Care | Payer: 59 | Source: Ambulatory Visit | Attending: Obstetrics & Gynecology | Admitting: Obstetrics & Gynecology

## 2010-11-10 DIAGNOSIS — Z01419 Encounter for gynecological examination (general) (routine) without abnormal findings: Secondary | ICD-10-CM | POA: Insufficient documentation

## 2010-11-16 NOTE — Progress Notes (Signed)
AGREE

## 2011-01-21 LAB — CBC
HCT: 34.3 — ABNORMAL LOW
Hemoglobin: 12.1
MCV: 98.7
RDW: 12.2

## 2011-01-21 LAB — RH IMMUNE GLOB WKUP(>/=20WKS)(NOT WOMEN'S HOSP)

## 2011-02-22 ENCOUNTER — Telehealth: Payer: Self-pay | Admitting: Gastroenterology

## 2011-02-22 NOTE — Telephone Encounter (Signed)
[facility] [place]

## 2011-02-22 NOTE — Telephone Encounter (Signed)
NEXIUM 40 MG 30 MINUTES PRIOR TO BREAKFAST #31, [id number].

## 2011-02-22 NOTE — Telephone Encounter (Signed)
Wants her nexium refilled but has been getting it through mail order an now [facility] [place]

## 2011-02-24 ENCOUNTER — Other Ambulatory Visit: Payer: Self-pay

## 2011-02-24 MED ORDER — IMIPRAMINE HCL 10 MG PO TABS: 10.0000 mg | ORAL_TABLET | Freq: Every day | ORAL | Status: DC

## 2011-06-02 ENCOUNTER — Other Ambulatory Visit: Payer: Self-pay | Admitting: Gastroenterology

## 2011-10-07 ENCOUNTER — Other Ambulatory Visit: Payer: Self-pay | Admitting: Gastroenterology

## 2011-11-02 ENCOUNTER — Other Ambulatory Visit: Payer: Self-pay | Admitting: Gastroenterology

## 2011-11-02 NOTE — Telephone Encounter (Signed)
Can we find out what the pharmacy means by this?

## 2011-11-03 NOTE — Telephone Encounter (Signed)
Called the pharmacy to see what was going on. They said that she need to have a office visit before any refills. I told them that she did come in but we could not send it, do they are going to try and fill it.

## 2011-11-25 ENCOUNTER — Other Ambulatory Visit (HOSPITAL_COMMUNITY)
Admission: RE | Admit: 2011-11-25 | Discharge: 2011-11-25 | Disposition: A | Discharge status: Home / Self Care | Payer: 59 | Source: Ambulatory Visit | Attending: Obstetrics & Gynecology | Admitting: Obstetrics & Gynecology

## 2011-11-25 ENCOUNTER — Other Ambulatory Visit: Payer: Self-pay | Admitting: Obstetrics & Gynecology

## 2011-11-25 DIAGNOSIS — Z01419 Encounter for gynecological examination (general) (routine) without abnormal findings: Secondary | ICD-10-CM | POA: Insufficient documentation

## 2011-11-26 ENCOUNTER — Other Ambulatory Visit: Payer: Self-pay | Admitting: Obstetrics & Gynecology

## 2011-11-26 DIAGNOSIS — Z139 Encounter for screening, unspecified: Secondary | ICD-10-CM

## 2012-01-03 ENCOUNTER — Ambulatory Visit (HOSPITAL_COMMUNITY)
Admission: RE | Admit: 2012-01-03 | Discharge: 2012-01-03 | Disposition: A | Discharge status: Home / Self Care | Payer: 59 | Source: Ambulatory Visit | Attending: Obstetrics & Gynecology | Admitting: Obstetrics & Gynecology

## 2012-01-03 DIAGNOSIS — Z139 Encounter for screening, unspecified: Secondary | ICD-10-CM

## 2012-01-03 DIAGNOSIS — Z1231 Encounter for screening mammogram for malignant neoplasm of breast: Secondary | ICD-10-CM | POA: Insufficient documentation

## 2012-02-23 ENCOUNTER — Other Ambulatory Visit: Payer: Self-pay | Admitting: Gastroenterology

## 2012-02-24 NOTE — Telephone Encounter (Signed)
She is due for her one yr f/u visit. RX sent.

## 2012-03-01 ENCOUNTER — Encounter: Payer: Self-pay | Admitting: Urgent Care

## 2012-03-01 NOTE — Telephone Encounter (Signed)
Mailed letter to patient to call our office to set up OV to further her refills °

## 2012-04-03 ENCOUNTER — Other Ambulatory Visit: Payer: Self-pay | Admitting: Gastroenterology

## 2012-04-03 MED ORDER — ESOMEPRAZOLE MAGNESIUM 40 MG PO CPDR: 40.0000 mg | DELAYED_RELEASE_CAPSULE | Freq: Every day | ORAL | Status: DC

## 2012-04-03 NOTE — Telephone Encounter (Signed)
[name] says she is out of her Imipramine and will need another refill before her next appt in Jan and she will also be out of her Nexium before Jan 20th

## 2012-04-03 NOTE — Addendum Note (Signed)
Addended by: Tiffany Kocher on: 04/03/2012 10:56 AM   Modules accepted: Orders

## 2012-04-03 NOTE — Telephone Encounter (Signed)
Forwarding to Refill box.  

## 2012-04-27 ENCOUNTER — Encounter: Payer: Self-pay | Admitting: Gastroenterology

## 2012-05-01 ENCOUNTER — Encounter: Payer: Self-pay | Admitting: Gastroenterology

## 2012-05-01 ENCOUNTER — Ambulatory Visit (INDEPENDENT_AMBULATORY_CARE_PROVIDER_SITE_OTHER): Payer: 59 | Admitting: Gastroenterology

## 2012-05-01 VITALS — BP 95/58 | HR 67 | Temp 98.2°F | Ht 66.0 in | Wt 136.0 lb

## 2012-05-01 DIAGNOSIS — K30 Functional dyspepsia: Secondary | ICD-10-CM

## 2012-05-01 DIAGNOSIS — K219 Gastro-esophageal reflux disease without esophagitis: Secondary | ICD-10-CM

## 2012-05-01 DIAGNOSIS — R1013 Epigastric pain: Secondary | ICD-10-CM

## 2012-05-01 MED ORDER — IMIPRAMINE HCL 10 MG PO TABS: 10.0000 mg | ORAL_TABLET | Freq: Every day | ORAL | Status: DC

## 2012-05-01 MED ORDER — ESOMEPRAZOLE MAGNESIUM 40 MG PO CPDR: 40.0000 mg | DELAYED_RELEASE_CAPSULE | Freq: Every day | ORAL | Status: DC

## 2012-05-01 NOTE — Patient Instructions (Addendum)
Continue taking imipramine each night. Continue Nexium in the morning, 30 minutes before the first meal of the day.  We will see you back in 2 years or sooner if necessary.  Have fun at the movies!

## 2012-05-01 NOTE — Progress Notes (Signed)
Referring Provider: Lazaro Arms, MD Primary Care Physician:  Lazaro Arms, MD Primary Gastroenterologist: Dr. Darrick Penna   Chief Complaint  Patient presents with  . Follow-up    HPI:   42 year old pleasant female with hx of NUD and GERD, presents today for follow-up. Last office visit July 2013. She has been on imipramine and Nexium. Wt is up about 5 lbs from July 2012. Denies abdominal pain unless drinks too much coffee. GERD improved. Denies any constipation, diarrhea. Denies rectal bleeding.   Past Medical History  Diagnosis Date  . Food intolerance   . GERD (gastroesophageal reflux disease)   . Nonulcer dyspepsia     Past Surgical History  Procedure Date  . Esophagogastroduodenoscopy 05/31/2008    [id number] Gland Polyps/Benign polyps    Current Outpatient Prescriptions  Medication Sig Dispense Refill  . esomeprazole (NEXIUM) 40 MG capsule Take 1 capsule (40 mg total) by mouth daily before breakfast.  31 capsule  11  . imipramine (TOFRANIL) 10 MG tablet Take 1 tablet (10 mg total) by mouth at bedtime.  30 tablet  1    Allergies as of 05/01/2012  . (No Known Allergies)    Family History  Problem Relation Age of Onset  . Colon cancer Neg Hx   . Colon polyps Father     age 59s    History   Social History  . Marital Status: Married    Spouse Name: N/A    Number of Children: 1  . Years of Education: N/A   Occupational History  . teacher    Social History Main Topics  . Smoking status: Never Smoker   . Smokeless tobacco: None  . Alcohol Use: Yes     Comment: sometimes  . Drug Use: No  . Sexually Active: None   Other Topics Concern  . None   Social History Narrative  . None    Review of Systems: Gen: Denies fever, chills, anorexia. Denies fatigue, weakness, weight loss.  CV: Denies chest pain, palpitations, syncope, peripheral edema, and claudication. Resp: Denies dyspnea at rest, cough, wheezing, coughing up blood, and pleurisy. GI: Denies  vomiting blood, jaundice, and fecal incontinence.   Denies dysphagia or odynophagia. Derm: Denies rash, itching, dry skin Psych: Denies depression, anxiety, memory loss, confusion. No homicidal or suicidal ideation.  Heme: Denies bruising, bleeding, and enlarged lymph nodes.  Physical Exam: BP 95/58  Pulse 67  Temp 98.2 F (36.8 C) (Oral)  Ht 5\' 6"  (1.676 m)  Wt 136 lb (61.689 kg)  BMI 21.95 kg/m2 General:   Alert and oriented. No distress noted. Pleasant and cooperative.  Head:  Normocephalic and atraumatic. Eyes:  Conjuctiva clear without scleral icterus. Neck:  Supple, without mass or thyromegaly. Heart:  S1, S2 present without murmurs, rubs, or gallops. Regular rate and rhythm. Abdomen:  +BS, soft, non-tender and non-distended. No rebound or guarding. No HSM or masses noted. Msk:  Symmetrical without gross deformities. Normal posture. Pulses:  2+ DP noted bilaterally Extremities:  Without edema. Neurologic:  Alert and  oriented x4;  grossly normal neurologically. Skin:  Intact without significant lesions or rashes. Cervical Nodes:  No significant cervical adenopathy. Psych:  Alert and cooperative. Normal mood and affect.

## 2012-05-01 NOTE — Progress Notes (Signed)
Faxed to PCP

## 2012-05-01 NOTE — Assessment & Plan Note (Signed)
Only notes abdominal discomfort if excess coffee intake. Continue low-dose imipramine each evening. Refills provided. 2 year f/u.

## 2012-05-01 NOTE — Assessment & Plan Note (Signed)
Doing well on Nexium daily. Refills provided.  Return in 2 years.

## 2012-06-22 NOTE — Progress Notes (Signed)
PT NEEDS OPV IN 1 YEAR. REFILL IMIPRAMINE X1 YEAR  REVIEWED.

## 2012-07-03 NOTE — Progress Notes (Signed)
[name], please nic OV for 1 year. I had originally said 2, but this is too far out.

## 2012-07-04 NOTE — Progress Notes (Signed)
Reminder in epic °

## 2012-11-27 ENCOUNTER — Encounter: Payer: Self-pay | Admitting: Obstetrics & Gynecology

## 2012-11-27 ENCOUNTER — Other Ambulatory Visit (HOSPITAL_COMMUNITY)
Admission: RE | Admit: 2012-11-27 | Discharge: 2012-11-27 | Disposition: A | Discharge status: Home / Self Care | Payer: 59 | Source: Ambulatory Visit | Attending: Obstetrics & Gynecology | Admitting: Obstetrics & Gynecology

## 2012-11-27 ENCOUNTER — Ambulatory Visit (INDEPENDENT_AMBULATORY_CARE_PROVIDER_SITE_OTHER): Payer: 59 | Admitting: Obstetrics & Gynecology

## 2012-11-27 ENCOUNTER — Other Ambulatory Visit: Payer: Self-pay | Admitting: Obstetrics & Gynecology

## 2012-11-27 VITALS — BP 100/60 | Ht 65.0 in | Wt 140.0 lb

## 2012-11-27 DIAGNOSIS — Z01419 Encounter for gynecological examination (general) (routine) without abnormal findings: Secondary | ICD-10-CM | POA: Insufficient documentation

## 2012-11-27 DIAGNOSIS — Z139 Encounter for screening, unspecified: Secondary | ICD-10-CM

## 2012-11-27 DIAGNOSIS — Z1151 Encounter for screening for human papillomavirus (HPV): Secondary | ICD-10-CM | POA: Insufficient documentation

## 2012-11-27 NOTE — Addendum Note (Signed)
Addended by: Colen Darling on: 11/27/2012 10:23 AM   Modules accepted: Orders

## 2012-11-27 NOTE — Progress Notes (Signed)
Patient ID: [name], female   DOB: [birth year], 42 y.o.   MRN: [id number] Subjective:     [name] is a 42 y.o. female here for a routine exam.  No LMP recorded. Patient is not currently having periods (Reason: IUD). No obstetric history on file. Current complaints: none.  Personal health questionnaire reviewed: no.   Gynecologic History No LMP recorded. Patient is not currently having periods (Reason: IUD). Contraception: IUD Last Pap: 2013. Results were: normal Last mammogram: 2013. Results were: normal  Obstetric History OB History  No data available     The following portions of the patient's history were reviewed and updated as appropriate: allergies, current medications, past family history, past medical history, past social history, past surgical history and problem list.  Review of Systems  Review of Systems  Constitutional: Negative for fever, chills, weight loss, malaise/fatigue and diaphoresis.  HENT: Negative for hearing loss, ear pain, nosebleeds, congestion, sore throat, neck pain, tinnitus and ear discharge.   Eyes: Negative for blurred vision, double vision, photophobia, pain, discharge and redness.  Respiratory: Negative for cough, hemoptysis, sputum production, shortness of breath, wheezing and stridor.   Cardiovascular: Negative for chest pain, palpitations, orthopnea, claudication, leg swelling and PND.  Gastrointestinal: negative for abdominal pain. Negative for heartburn, nausea, vomiting, diarrhea, constipation, blood in stool and melena.  Genitourinary: Negative for dysuria, urgency, frequency, hematuria and flank pain.  Musculoskeletal: Negative for myalgias, back pain, joint pain and falls.  Skin: Negative for itching and rash.  Neurological: Negative for dizziness, tingling, tremors, sensory change, speech change, focal weakness, seizures, loss of consciousness, weakness and headaches.  Endo/Heme/Allergies: Negative for environmental allergies and  polydipsia. Does not bruise/bleed easily.  Psychiatric/Behavioral: Negative for depression, suicidal ideas, hallucinations, memory loss and substance abuse. The patient is not nervous/anxious and does not have insomnia.        Objective:    Physical Exam  Vitals reviewed. Constitutional: She is oriented to person, place, and time. She appears well-developed and well-nourished.  HENT:  Head: Normocephalic and atraumatic.        Right Ear: External ear normal.  Left Ear: External ear normal.  Nose: Nose normal.  Mouth/Throat: Oropharynx is clear and moist.  Eyes: Conjunctivae and EOM are normal. Pupils are equal, round, and reactive to light. Right eye exhibits no discharge. Left eye exhibits no discharge. No scleral icterus.  Neck: Normal range of motion. Neck supple. No tracheal deviation present. No thyromegaly present.  Cardiovascular: Normal rate, regular rhythm, normal heart sounds and intact distal pulses.  Exam reveals no gallop and no friction rub.   No murmur heard. Respiratory: Effort normal and breath sounds normal. No respiratory distress. She has no wheezes. She has no rales. She exhibits no tenderness.  GI: Soft. Bowel sounds are normal. She exhibits no distension and no mass. There is no tenderness. There is no rebound and no guarding.  Genitourinary:       Vulva is normal without lesions Vagina is pink moist without discharge Cervix normal in appearance and pap is done Uterus is normal size shape and contour Adnexa is negative with normal sized ovaries   Musculoskeletal: Normal range of motion. She exhibits no edema and no tenderness.  Neurological: She is alert and oriented to person, place, and time. She has normal reflexes. She displays normal reflexes. No cranial nerve deficit. She exhibits normal muscle tone. Coordination normal.  Skin: Skin is warm and dry. No rash noted. No erythema. No pallor.  Psychiatric:  She has a normal mood and affect. Her behavior is  normal. Judgment and thought content normal.       Assessment:    Healthy female exam.    Plan:    Contraception: IUD. Mammogram ordered. Follow up in: 1 year.

## 2013-01-08 ENCOUNTER — Ambulatory Visit (HOSPITAL_COMMUNITY)
Admission: RE | Admit: 2013-01-08 | Discharge: 2013-01-08 | Disposition: A | Discharge status: Home / Self Care | Payer: 59 | Source: Ambulatory Visit | Attending: Obstetrics & Gynecology | Admitting: Obstetrics & Gynecology

## 2013-01-08 DIAGNOSIS — Z139 Encounter for screening, unspecified: Secondary | ICD-10-CM

## 2013-01-08 DIAGNOSIS — Z1231 Encounter for screening mammogram for malignant neoplasm of breast: Secondary | ICD-10-CM | POA: Insufficient documentation

## 2013-04-07 ENCOUNTER — Other Ambulatory Visit: Payer: Self-pay | Admitting: Gastroenterology

## 2013-04-08 NOTE — Telephone Encounter (Signed)
Due f/u OV with [facility] 04/2013. Refill imipramine X 1. Nexium X 12.

## 2013-04-09 ENCOUNTER — Encounter: Payer: Self-pay | Admitting: Gastroenterology

## 2013-04-09 NOTE — Telephone Encounter (Signed)
Mailed letter to patient to contact our office to make OV to further RF

## 2013-06-07 ENCOUNTER — Ambulatory Visit: Payer: 59 | Admitting: Gastroenterology

## 2013-06-08 ENCOUNTER — Ambulatory Visit: Payer: 59 | Admitting: Gastroenterology

## 2013-06-10 ENCOUNTER — Other Ambulatory Visit: Payer: Self-pay | Admitting: Gastroenterology

## 2013-06-11 NOTE — Telephone Encounter (Signed)
Reminder in epic °

## 2013-06-11 NOTE — Telephone Encounter (Signed)
Patient needs appt for 1 year follow-up. Will refill X 1.

## 2013-06-12 ENCOUNTER — Encounter: Payer: Self-pay | Admitting: *Deleted

## 2013-07-02 ENCOUNTER — Ambulatory Visit: Payer: 59 | Admitting: Gastroenterology

## 2013-07-08 ENCOUNTER — Other Ambulatory Visit: Payer: Self-pay | Admitting: Gastroenterology

## 2013-07-11 ENCOUNTER — Encounter (INDEPENDENT_AMBULATORY_CARE_PROVIDER_SITE_OTHER): Payer: Self-pay

## 2013-07-11 ENCOUNTER — Encounter: Payer: Self-pay | Admitting: Gastroenterology

## 2013-07-11 ENCOUNTER — Ambulatory Visit (INDEPENDENT_AMBULATORY_CARE_PROVIDER_SITE_OTHER): Payer: 59 | Admitting: Gastroenterology

## 2013-07-11 ENCOUNTER — Ambulatory Visit: Payer: 59 | Admitting: Gastroenterology

## 2013-07-11 VITALS — BP 102/59 | HR 64 | Temp 98.2°F | Ht 66.0 in | Wt 140.0 lb

## 2013-07-11 DIAGNOSIS — K3189 Other diseases of stomach and duodenum: Secondary | ICD-10-CM

## 2013-07-11 DIAGNOSIS — K219 Gastro-esophageal reflux disease without esophagitis: Secondary | ICD-10-CM

## 2013-07-11 DIAGNOSIS — R1013 Epigastric pain: Secondary | ICD-10-CM

## 2013-07-11 DIAGNOSIS — K30 Functional dyspepsia: Secondary | ICD-10-CM

## 2013-07-11 MED ORDER — ESOMEPRAZOLE MAGNESIUM 40 MG PO CPDR: DELAYED_RELEASE_CAPSULE | ORAL | Status: DC

## 2013-07-11 MED ORDER — IMIPRAMINE HCL 10 MG PO TABS: ORAL_TABLET | ORAL | Status: DC

## 2013-07-11 NOTE — Assessment & Plan Note (Signed)
Doing well on Imipramine 10 mg each evening. Return in 1 year.

## 2013-07-11 NOTE — Progress Notes (Signed)
    Referring Provider: No ref. provider found Primary Care Physician:  No PCP Per Patient Primary GI: Dr. Darrick PennaFields   Chief Complaint  Patient presents with  . Follow-up    HPI:   43 year old female with history of NUD and GERD presents today in routine yearly follow-up. Imipramine and Nexium. Nexium once daily. Imipramine at bedtime. No N/V. No changes in bowel habits. No rectal bleeding.    Past Medical History  Diagnosis Date  . Food intolerance   . GERD (gastroesophageal reflux disease)   . Nonulcer dyspepsia     Past Surgical History  Procedure Laterality Date  . Esophagogastroduodenoscopy  05/31/2008    [id number]:Fundic Gland Polyps/Benign polyps    Current Outpatient Prescriptions  Medication Sig Dispense Refill  . imipramine (TOFRANIL) 10 MG tablet TAKE 1 TABLET (10 MG TOTAL) BY MOUTH AT BEDTIME.  30 tablet  0  . NEXIUM 40 MG capsule TAKE 1 CAPSULE (40 MG TOTAL) BY MOUTH DAILY BEFORE BREAKFAST.  30 capsule  11   No current facility-administered medications for this visit.    Allergies as of 07/11/2013  . (No Known Allergies)    Family History  Problem Relation Age of Onset  . Colon cancer Neg Hx   . Colon polyps Father     age 7760s    History   Social History  . Marital Status: Married    Spouse Name: N/A    Number of Children: 1  . Years of Education: N/A   Occupational History  . teacher     [employer]   Social History Main Topics  . Smoking status: Never Smoker   . Smokeless tobacco: None  . Alcohol Use: Yes     Comment: sometimes  . Drug Use: No  . Sexual Activity: Yes   Other Topics Concern  . None   Social History Narrative  . None    Review of Systems: As mentioned in HPI.   Physical Exam: BP 102/59  Pulse 64  Temp(Src) 98.2 F (36.8 C) (Oral)  Ht 5\' 6"  (1.676 m)  Wt 140 lb (63.504 kg)  BMI 22.61 kg/m2  LMP 07/04/2013 General:   Alert and oriented. No distress noted. Pleasant and cooperative.  Head:  Normocephalic and  atraumatic. Eyes:  Conjuctiva clear without scleral icterus. Heart:  S1, S2 present without murmurs, rubs, or gallops. Regular rate and rhythm. Abdomen:  +BS, soft, non-tender and non-distended. No rebound or guarding. No HSM or masses noted. Msk:  Symmetrical without gross deformities. Normal posture. Extremities:  Without edema. Neurologic:  Alert and  oriented x4;  grossly normal neurologically. Skin:  Intact without significant lesions or rashes. Psych:  Alert and cooperative. Normal mood and affect.

## 2013-07-11 NOTE — Assessment & Plan Note (Signed)
Controlled with Nexium daily. Return in 1 year.

## 2013-07-11 NOTE — Patient Instructions (Signed)
Continue to take Nexium each morning, 30 minutes before breakfast. Take Imipramine each evening. I have refilled both of these for one year.   We will see you back in 1 year or sooner if needed!  Have a great year!

## 2013-07-12 NOTE — Progress Notes (Signed)
NO PCP

## 2013-08-10 ENCOUNTER — Other Ambulatory Visit: Payer: Self-pay | Admitting: Gastroenterology

## 2013-08-29 ENCOUNTER — Other Ambulatory Visit: Payer: Self-pay

## 2013-08-29 MED ORDER — ESOMEPRAZOLE MAGNESIUM 40 MG PO CPDR: DELAYED_RELEASE_CAPSULE | ORAL | Status: DC

## 2013-10-01 ENCOUNTER — Telehealth: Payer: Self-pay | Admitting: *Deleted

## 2013-10-01 NOTE — Telephone Encounter (Signed)
[place] [facility] [place] about getting a 90 day supply for gerenic nexium. Please advise

## 2013-10-03 MED ORDER — ESOMEPRAZOLE MAGNESIUM 40 MG PO CPDR: DELAYED_RELEASE_CAPSULE | ORAL | Status: DC

## 2013-10-03 NOTE — Telephone Encounter (Signed)
Routing to refill box  

## 2013-10-03 NOTE — Telephone Encounter (Signed)
Done

## 2013-11-27 ENCOUNTER — Encounter: Payer: Self-pay | Admitting: Obstetrics & Gynecology

## 2013-11-27 ENCOUNTER — Ambulatory Visit (INDEPENDENT_AMBULATORY_CARE_PROVIDER_SITE_OTHER): Payer: 59 | Admitting: Obstetrics & Gynecology

## 2013-11-27 ENCOUNTER — Other Ambulatory Visit (HOSPITAL_COMMUNITY)
Admission: RE | Admit: 2013-11-27 | Discharge: 2013-11-27 | Disposition: A | Discharge status: Home / Self Care | Payer: 59 | Source: Ambulatory Visit | Attending: Obstetrics & Gynecology | Admitting: Obstetrics & Gynecology

## 2013-11-27 VITALS — BP 100/80 | Ht 66.0 in | Wt 136.0 lb

## 2013-11-27 DIAGNOSIS — Z1151 Encounter for screening for human papillomavirus (HPV): Secondary | ICD-10-CM | POA: Diagnosis present

## 2013-11-27 DIAGNOSIS — Z01419 Encounter for gynecological examination (general) (routine) without abnormal findings: Secondary | ICD-10-CM | POA: Diagnosis present

## 2013-11-27 NOTE — Addendum Note (Signed)
Addended by: Richardson ChiquitoRAVIS, ASHLEY M on: 11/27/2013 03:46 PM   Modules accepted: Orders

## 2013-11-27 NOTE — Progress Notes (Signed)
Patient ID: [name], female   DOB: 01/01/1971, 43 y.o.   MRN: [id number] Subjective:     [name] is a 43 y.o. female here for a routine exam.  Patient's last menstrual period was 11/06/2013. No obstetric history on file. Birth Control Method:  Mirena 2013 Menstrual Calendar(currently): irregular light  Current complaints: none.   Current acute medical issues:  none   Recent Gynecologic History Patient's last menstrual period was 11/06/2013. Last Pap: 2014,  normal Last mammogram: 01/2013,  normal  Past Medical History  Diagnosis Date  . Food intolerance   . GERD (gastroesophageal reflux disease)   . Nonulcer dyspepsia     Past Surgical History  Procedure Laterality Date  . Esophagogastroduodenoscopy  05/31/2008    WUJ:[id number] Gland Polyps/Benign polyps    OB History   Grav Para Term Preterm Abortions TAB SAB Ect Mult Living                  History   Social History  . Marital Status: Married    Spouse Name: N/A    Number of Children: 1  . Years of Education: N/A   Occupational History  . teacher     Water engineer   Social History Main Topics  . Smoking status: Never Smoker   . Smokeless tobacco: None  . Alcohol Use: Yes     Comment: sometimes  . Drug Use: No  . Sexual Activity: Yes   Other Topics Concern  . None   Social History Narrative  . None    Family History  Problem Relation Age of Onset  . Colon cancer Neg Hx   . Colon polyps Father     age 73s     Review of Systems  Review of Systems  Constitutional: Negative for fever, chills, weight loss, malaise/fatigue and diaphoresis.  HENT: Negative for hearing loss, ear pain, nosebleeds, congestion, sore throat, neck pain, tinnitus and ear discharge.   Eyes: Negative for blurred vision, double vision, photophobia, pain, discharge and redness.  Respiratory: Negative for cough, hemoptysis, sputum production, shortness of breath, wheezing and stridor.   Cardiovascular: Negative for chest  pain, palpitations, orthopnea, claudication, leg swelling and PND.  Gastrointestinal: negative for abdominal pain. Negative for heartburn, nausea, vomiting, diarrhea, constipation, blood in stool and melena.  Genitourinary: Negative for dysuria, urgency, frequency, hematuria and flank pain.  Musculoskeletal: Negative for myalgias, back pain, joint pain and falls.  Skin: Negative for itching and rash.  Neurological: Negative for dizziness, tingling, tremors, sensory change, speech change, focal weakness, seizures, loss of consciousness, weakness and headaches.  Endo/Heme/Allergies: Negative for environmental allergies and polydipsia. Does not bruise/bleed easily.  Psychiatric/Behavioral: Negative for depression, suicidal ideas, hallucinations, memory loss and substance abuse. The patient is not nervous/anxious and does not have insomnia.        Objective:    Physical Exam  Vitals reviewed. Constitutional: She is oriented to person, place, and time. She appears well-developed and well-nourished.  HENT:  Head: Normocephalic and atraumatic.        Right Ear: External ear normal.  Left Ear: External ear normal.  Nose: Nose normal.  Mouth/Throat: Oropharynx is clear and moist.  Eyes: Conjunctivae and EOM are normal. Pupils are equal, round, and reactive to light. Right eye exhibits no discharge. Left eye exhibits no discharge. No scleral icterus.  Neck: Normal range of motion. Neck supple. No tracheal deviation present. No thyromegaly present.  Cardiovascular: Normal rate, regular rhythm, normal heart sounds and intact distal  pulses.  Exam reveals no gallop and no friction rub.   No murmur heard. Respiratory: Effort normal and breath sounds normal. No respiratory distress. She has no wheezes. She has no rales. She exhibits no tenderness.  GI: Soft. Bowel sounds are normal. She exhibits no distension and no mass. There is no tenderness. There is no rebound and no guarding.  Genitourinary:   Breasts no masses skin changes or nipple changes bilaterally      Vulva is normal without lesions Vagina is pink moist without discharge Cervix normal in appearance and pap is done, strings not visible Uterus is normal size shape and contour Adnexa is negative with normal sized ovaries   Musculoskeletal: Normal range of motion. She exhibits no edema and no tenderness.  Neurological: She is alert and oriented to person, place, and time. She has normal reflexes. She displays normal reflexes. No cranial nerve deficit. She exhibits normal muscle tone. Coordination normal.  Skin: Skin is warm and dry. No rash noted. No erythema. No pallor.  Psychiatric: She has a normal mood and affect. Her behavior is normal. Judgment and thought content normal.       Assessment:    Healthy female exam.    Plan:    Contraception: IUD. Follow up in: 1 year.

## 2013-11-29 ENCOUNTER — Other Ambulatory Visit: Payer: 59 | Admitting: Obstetrics & Gynecology

## 2013-11-29 LAB — CYTOLOGY - PAP

## 2013-11-30 ENCOUNTER — Other Ambulatory Visit: Payer: Self-pay | Admitting: Obstetrics & Gynecology

## 2013-11-30 DIAGNOSIS — Z139 Encounter for screening, unspecified: Secondary | ICD-10-CM

## 2014-01-14 ENCOUNTER — Ambulatory Visit (HOSPITAL_COMMUNITY): Payer: 59

## 2014-01-14 ENCOUNTER — Ambulatory Visit (HOSPITAL_COMMUNITY)
Admission: RE | Admit: 2014-01-14 | Discharge: 2014-01-14 | Disposition: A | Discharge status: Home / Self Care | Payer: 59 | Source: Ambulatory Visit | Attending: Obstetrics & Gynecology | Admitting: Obstetrics & Gynecology

## 2014-01-14 DIAGNOSIS — Z139 Encounter for screening, unspecified: Secondary | ICD-10-CM

## 2014-01-14 DIAGNOSIS — Z1231 Encounter for screening mammogram for malignant neoplasm of breast: Secondary | ICD-10-CM | POA: Insufficient documentation

## 2014-04-01 ENCOUNTER — Encounter: Payer: Self-pay | Admitting: Gastroenterology

## 2014-06-10 ENCOUNTER — Encounter: Payer: Self-pay | Admitting: Gastroenterology

## 2014-06-10 ENCOUNTER — Other Ambulatory Visit: Payer: Self-pay | Admitting: Gastroenterology

## 2014-07-25 ENCOUNTER — Ambulatory Visit: Payer: Self-pay | Admitting: Gastroenterology

## 2014-08-08 ENCOUNTER — Encounter: Payer: Self-pay | Admitting: Gastroenterology

## 2014-09-20 ENCOUNTER — Other Ambulatory Visit: Payer: Self-pay | Admitting: Gastroenterology

## 2014-11-10 NOTE — Progress Notes (Signed)
REVIEWED-NO ADDITIONAL RECOMMENDATIONS. 

## 2014-12-03 ENCOUNTER — Encounter: Payer: Self-pay | Admitting: Adult Health

## 2014-12-03 ENCOUNTER — Other Ambulatory Visit (HOSPITAL_COMMUNITY)
Admission: RE | Admit: 2014-12-03 | Discharge: 2014-12-03 | Disposition: A | Discharge status: Home / Self Care | Payer: 59 | Source: Ambulatory Visit | Attending: Adult Health | Admitting: Adult Health

## 2014-12-03 ENCOUNTER — Ambulatory Visit (INDEPENDENT_AMBULATORY_CARE_PROVIDER_SITE_OTHER): Payer: 59 | Admitting: Adult Health

## 2014-12-03 VITALS — BP 98/60 | HR 76 | Ht 66.0 in | Wt 134.0 lb

## 2014-12-03 DIAGNOSIS — Z1211 Encounter for screening for malignant neoplasm of colon: Secondary | ICD-10-CM | POA: Diagnosis not present

## 2014-12-03 DIAGNOSIS — Z1151 Encounter for screening for human papillomavirus (HPV): Secondary | ICD-10-CM | POA: Insufficient documentation

## 2014-12-03 DIAGNOSIS — Z975 Presence of (intrauterine) contraceptive device: Secondary | ICD-10-CM

## 2014-12-03 DIAGNOSIS — Z01419 Encounter for gynecological examination (general) (routine) without abnormal findings: Secondary | ICD-10-CM

## 2014-12-03 DIAGNOSIS — Z01411 Encounter for gynecological examination (general) (routine) with abnormal findings: Secondary | ICD-10-CM | POA: Diagnosis present

## 2014-12-03 HISTORY — DX: Presence of (intrauterine) contraceptive device: Z97.5

## 2014-12-03 LAB — HEMOCCULT GUIAC POC 1CARD (OFFICE): Fecal Occult Blood, POC: NEGATIVE

## 2014-12-03 NOTE — Progress Notes (Signed)
Patient ID: [name], female   DOB: March 31, 1971, 44 y.o.   MRN: [id number] History of Present Illness: [name] is a 44 year old white female,married in for well woman gyn exam and pap, no complaints.Has IUD and no period.Had labs at PCP.She is a Runner, broadcasting/film/video,   Current Medications, Allergies, Past Medical History, Past Surgical History, Family History and Social History were reviewed in [employer] record.     Review of Systems:  Patient denies any headaches, hearing loss, fatigue, blurred vision, shortness of breath, chest pain, abdominal pain, problems with bowel movements, urination, or intercourse. No joint pain or mood swings.   Physical Exam:BP 98/60 mmHg  Pulse 76  Ht  (1.676 m)  Wt 134 lb (60.782 kg)  BMI 21.64 kg/m2 General:  Well developed, well nourished, no acute distress Skin:  Warm and dry Neck:  Midline trachea, normal thyroid, good ROM, no lymphadenopathy Lungs; Clear to auscultation bilaterally Breast:  No dominant palpable mass, retraction, or nipple discharge Cardiovascular: Regular rate and rhythm Abdomen:  Soft, non tender, no hepatosplenomegaly Pelvic:  External genitalia is normal in appearance, no lesions.  The vagina is normal in appearance. Urethra has no lesions or masses. The cervix is bulbous.No IUD strings seen, pap with HPV performed, US shows IUD in place.  Uterus is felt to be normal size, shape, and contour.  No adnexal masses or tenderness noted.Bladder is non tender, no masses felt. Rectal: Good sphincter tone, no polyps, or hemorrhoids felt.  Hemoccult negative.+rectocele  Extremities/musculoskeletal:  No swelling or varicosities noted, no clubbing or cyanosis Psych:  No mood changes, alert and cooperative,seems happy   Impression: Well woman gyn exam with pap IUD in place    Plan: Physical in 1 year, pap in 3 years if normal with negative HPV. Mammogram yearly Colonoscopy at 89

## 2014-12-03 NOTE — Patient Instructions (Signed)
Physical in 1 year Mammogram yearly  

## 2014-12-04 LAB — CYTOLOGY - PAP

## 2014-12-05 ENCOUNTER — Other Ambulatory Visit: Payer: Self-pay | Admitting: Obstetrics & Gynecology

## 2014-12-11 ENCOUNTER — Ambulatory Visit: Payer: Self-pay | Admitting: Nurse Practitioner

## 2014-12-23 ENCOUNTER — Other Ambulatory Visit: Payer: Self-pay | Admitting: Adult Health

## 2014-12-23 DIAGNOSIS — Z1231 Encounter for screening mammogram for malignant neoplasm of breast: Secondary | ICD-10-CM

## 2015-01-20 ENCOUNTER — Ambulatory Visit (HOSPITAL_COMMUNITY)
Admission: RE | Admit: 2015-01-20 | Discharge: 2015-01-20 | Disposition: A | Discharge status: Home / Self Care | Payer: 59 | Source: Ambulatory Visit | Attending: Adult Health | Admitting: Adult Health

## 2015-01-20 DIAGNOSIS — Z1231 Encounter for screening mammogram for malignant neoplasm of breast: Secondary | ICD-10-CM | POA: Insufficient documentation

## 2015-01-27 ENCOUNTER — Other Ambulatory Visit: Payer: Self-pay

## 2015-01-27 ENCOUNTER — Encounter: Payer: Self-pay | Admitting: Gastroenterology

## 2015-01-27 MED ORDER — IMIPRAMINE HCL 10 MG PO TABS
ORAL_TABLET | ORAL | Status: DC
Start: 2015-01-27 — End: 2015-02-21

## 2015-01-27 NOTE — Telephone Encounter (Signed)
RF provided for 3 months. Needs nonurgent OV with [facility].

## 2015-01-27 NOTE — Telephone Encounter (Signed)
APPT MADE AND LETTER SENT  °

## 2015-02-20 ENCOUNTER — Ambulatory Visit: Payer: 59 | Admitting: Gastroenterology

## 2015-02-21 ENCOUNTER — Encounter: Payer: Self-pay | Admitting: Gastroenterology

## 2015-02-21 ENCOUNTER — Ambulatory Visit (INDEPENDENT_AMBULATORY_CARE_PROVIDER_SITE_OTHER): Payer: 59 | Admitting: Gastroenterology

## 2015-02-21 VITALS — BP 100/65 | HR 70 | Temp 97.4°F | Ht 66.0 in | Wt 134.8 lb

## 2015-02-21 DIAGNOSIS — K219 Gastro-esophageal reflux disease without esophagitis: Secondary | ICD-10-CM

## 2015-02-21 DIAGNOSIS — K30 Functional dyspepsia: Secondary | ICD-10-CM

## 2015-02-21 MED ORDER — IMIPRAMINE HCL 10 MG PO TABS
ORAL_TABLET | ORAL | Status: DC
Start: 2015-02-21 — End: 2016-02-15

## 2015-02-21 MED ORDER — ESOMEPRAZOLE MAGNESIUM 40 MG PO CPDR: DELAYED_RELEASE_CAPSULE | ORAL | Status: DC

## 2015-02-21 NOTE — Progress Notes (Signed)
No pcp per patient 

## 2015-02-21 NOTE — Progress Notes (Signed)
      Primary Care Physician: No PCP Per Patient  Primary Gastroenterologist:  Jonette EvaSandi Fields, MD   Chief Complaint  Patient presents with  . Follow-up  . Medication Refill    HPI: [name] is a 44 y.o. female here for follow-up. She has a history of nonulcer dyspepsia, GERD. Has been on imipramine and Nexium. Doing very well. She wonders if she should try to come off Nexium. She has been on chronic PPI for years. She voices concerns about chronic use. No abdominal pain, dysphagia, diarrhea, constipation, melena, brbpr.   Current Outpatient Prescriptions  Medication Sig Dispense Refill  . esomeprazole (NEXIUM) 40 MG capsule TAKE 1 CAPSULE (40 MG TOTAL) BY MOUTH DAILY BEFORE BREAKFAST. 90 capsule 3  . imipramine (TOFRANIL) 10 MG tablet TAKE 1 TABLET (10 MG TOTAL) BY MOUTH AT BEDTIME. 30 tablet 2  . levonorgestrel (MIRENA) 20 MCG/24HR IUD 1 each by Intrauterine route once.     No current facility-administered medications for this visit.    Allergies as of 02/21/2015  . (No Known Allergies)    ROS:  General: Negative for anorexia, weight loss, fever, chills, fatigue, weakness. ENT: Negative for hoarseness, difficulty swallowing , nasal congestion. CV: Negative for chest pain, angina, palpitations, dyspnea on exertion, peripheral edema.  Respiratory: Negative for dyspnea at rest, dyspnea on exertion, cough, sputum, wheezing.  GI: See history of present illness. GU:  Negative for dysuria, hematuria, urinary incontinence, urinary frequency, nocturnal urination.  Endo: Negative for unusual weight change.    Physical Examination:   BP 100/65 mmHg  Pulse 70  Temp(Src) 97.4 F (36.3 C)  Ht 5\' 6"  (1.676 m)  Wt 134 lb 12.8 oz (61.145 kg)  BMI 21.77 kg/m2  General: Well-nourished, well-developed in no acute distress.  Eyes: No icterus. Mouth: Oropharyngeal mucosa moist and pink , no lesions erythema or exudate. Lungs: Clear to auscultation bilaterally.  Heart: Regular rate  and rhythm, no murmurs rubs or gallops.  Abdomen: Bowel sounds are normal, nontender, nondistended, no hepatosplenomegaly or masses, no abdominal bruits or hernia , no rebound or guarding.   Extremities: No lower extremity edema. No clubbing or deformities. Neuro: Alert and oriented x 4   Skin: Warm and dry, no jaundice.   Psych: Alert and cooperative, normal mood and affect.

## 2015-02-21 NOTE — Patient Instructions (Signed)
1. Try taking Nexium every other day. If you do okay can continue at this dose but if you have recurrent symptoms 2-3 times weekly then you should continue taking on a daily basis. 2. Multivitamin with iron daily. 3. Return to the office in one year.

## 2015-02-21 NOTE — Assessment & Plan Note (Signed)
GERD and nonulcer dyspepsia controlled on current regimen. She would like to try to decrease Nexium use, advised every other day use initially. If she does well then she could try to continue tapering. If she has recurrent symptoms 2-3 times weekly she should resume medication. New prescriptions for Nexium and imipramine provided. Return to the office in one year or sooner if needed.

## 2015-09-10 ENCOUNTER — Other Ambulatory Visit: Payer: Self-pay | Admitting: Nurse Practitioner

## 2016-01-22 ENCOUNTER — Encounter: Payer: Self-pay | Admitting: Gastroenterology

## 2016-02-15 ENCOUNTER — Other Ambulatory Visit: Payer: Self-pay | Admitting: Gastroenterology

## 2016-08-10 ENCOUNTER — Other Ambulatory Visit: Payer: Self-pay | Admitting: Gastroenterology

## 2016-08-11 NOTE — Telephone Encounter (Signed)
LMOM for the pt about the refills and OV needed with Dr. Darrick Penna. Forwarding to Latta to schedule.

## 2016-08-11 NOTE — Telephone Encounter (Signed)
Needs ov with [facility] only. Last seen 02/2015.  Limited refills for imipramine and nexium today.

## 2016-11-05 ENCOUNTER — Other Ambulatory Visit: Payer: Self-pay | Admitting: Gastroenterology

## 2016-11-06 ENCOUNTER — Other Ambulatory Visit: Payer: Self-pay | Admitting: Gastroenterology

## 2017-05-06 ENCOUNTER — Other Ambulatory Visit: Payer: Self-pay | Admitting: Gastroenterology

## 2017-05-06 NOTE — Telephone Encounter (Signed)
I have refilled for one month with 1 refill, but she is overdue for office visit. Needs office visit for any further.

## 2017-05-09 NOTE — Telephone Encounter (Signed)
LMOM that Rx has been sent in and she needs OV prior to additional refills.

## 2017-07-03 ENCOUNTER — Other Ambulatory Visit: Payer: Self-pay | Admitting: Gastroenterology

## 2017-07-04 ENCOUNTER — Telehealth: Payer: Self-pay | Admitting: Gastroenterology

## 2017-07-04 NOTE — Telephone Encounter (Signed)
I received another refill request. I refilled this in January for one month with one final refill. She has not been seen since 2016. I had said in Jan she needed to be seen again prior to further refills. I don't know if this was communicated to her, as I do not see any appt for her since Nov 2016. She needs an appt prior to any other refills.

## 2017-07-06 NOTE — Telephone Encounter (Signed)
LMOM for a return call.  

## 2017-07-06 NOTE — Telephone Encounter (Signed)
Letter mailed to pt.  

## 2017-08-01 ENCOUNTER — Other Ambulatory Visit: Payer: Self-pay | Admitting: Gastroenterology
